# Patient Record
Sex: Female | Born: 1955 | Race: White | Hispanic: No | Marital: Married | Smoking: Former smoker
Health system: Southern US, Community
[De-identification: ages and names within clinical notes are randomized; demographics above are authoritative.]

## PROBLEM LIST (undated history)

## (undated) DIAGNOSIS — S52202A Unspecified fracture of shaft of left ulna, initial encounter for closed fracture: Secondary | ICD-10-CM

## (undated) DIAGNOSIS — S52502A Unspecified fracture of the lower end of left radius, initial encounter for closed fracture: Secondary | ICD-10-CM

## (undated) DIAGNOSIS — M810 Age-related osteoporosis without current pathological fracture: Secondary | ICD-10-CM

## (undated) DIAGNOSIS — G1221 Amyotrophic lateral sclerosis: Secondary | ICD-10-CM

## (undated) DIAGNOSIS — K900 Celiac disease: Secondary | ICD-10-CM

## (undated) HISTORY — PX: FEMUR FRACTURE SURGERY: SHX633

---

## 1992-12-28 HISTORY — PX: ABDOMINAL HYSTERECTOMY: SHX81

## 1998-11-11 ENCOUNTER — Other Ambulatory Visit: Admission: RE | Admit: 1998-11-11 | Discharge: 1998-11-11 | Payer: Self-pay | Admitting: Obstetrics and Gynecology

## 2012-03-29 DIAGNOSIS — K900 Celiac disease: Secondary | ICD-10-CM | POA: Insufficient documentation

## 2013-03-20 DIAGNOSIS — S7221XA Displaced subtrochanteric fracture of right femur, initial encounter for closed fracture: Secondary | ICD-10-CM | POA: Insufficient documentation

## 2013-03-22 DIAGNOSIS — W19XXXA Unspecified fall, initial encounter: Secondary | ICD-10-CM | POA: Insufficient documentation

## 2013-10-16 DIAGNOSIS — R1310 Dysphagia, unspecified: Secondary | ICD-10-CM | POA: Insufficient documentation

## 2013-10-16 DIAGNOSIS — R29898 Other symptoms and signs involving the musculoskeletal system: Secondary | ICD-10-CM | POA: Insufficient documentation

## 2013-10-16 DIAGNOSIS — R471 Dysarthria and anarthria: Secondary | ICD-10-CM | POA: Insufficient documentation

## 2018-01-26 LAB — HM MAMMOGRAPHY

## 2018-02-16 LAB — HM DEXA SCAN

## 2018-02-17 LAB — CBC AND DIFFERENTIAL
HCT: 43 (ref 36–46)
Hemoglobin: 14 (ref 12.0–16.0)
Platelets: 292 (ref 150–399)
WBC: 9.2

## 2018-02-17 LAB — HEPATIC FUNCTION PANEL
ALT: 22 (ref 7–35)
AST: 21 (ref 13–35)
Alkaline Phosphatase: 78 (ref 25–125)
Bilirubin, Total: 0.3

## 2018-02-17 LAB — BASIC METABOLIC PANEL
BUN: 13 (ref 4–21)
Creatinine: 0.9 (ref 0.5–1.1)
Glucose: 85
Potassium: 3.9 (ref 3.4–5.3)
Sodium: 139 (ref 137–147)

## 2018-02-17 LAB — VITAMIN D 25 HYDROXY (VIT D DEFICIENCY, FRACTURES): Vit D, 25-Hydroxy: 29.2

## 2018-02-17 LAB — HM HEPATITIS C SCREENING LAB: HM Hepatitis Screen: NEGATIVE

## 2018-02-17 LAB — LIPID PANEL
LDL Cholesterol: 11
Triglycerides: 56 (ref 40–160)

## 2018-02-23 LAB — HM COLONOSCOPY

## 2018-02-24 LAB — HM PAP SMEAR

## 2019-02-15 ENCOUNTER — Ambulatory Visit
Admission: EM | Admit: 2019-02-15 | Discharge: 2019-02-15 | Disposition: A | Discharge status: Home / Self Care | Payer: Medicare HMO | Attending: Family Medicine | Admitting: Family Medicine

## 2019-02-15 DIAGNOSIS — J069 Acute upper respiratory infection, unspecified: Secondary | ICD-10-CM | POA: Diagnosis not present

## 2019-02-15 DIAGNOSIS — R05 Cough: Secondary | ICD-10-CM

## 2019-02-15 DIAGNOSIS — R059 Cough, unspecified: Secondary | ICD-10-CM

## 2019-02-15 HISTORY — DX: Age-related osteoporosis without current pathological fracture: M81.0

## 2019-02-15 MED ORDER — BENZONATATE 100 MG PO CAPS: ORAL_CAPSULE | ORAL | 0 refills | Status: DC

## 2019-02-15 NOTE — ED Triage Notes (Signed)
Pt c/o cough, nasal/head/chest congestion x1wk

## 2019-02-15 NOTE — ED Provider Notes (Signed)
[facility]   [id number] 02/15/19 Arrival Time: 4825  ASSESSMENT & PLAN:  1. Viral upper respiratory tract infection   2. Cough     Meds ordered this encounter  Medications  . benzonatate (TESSALON) 100 MG capsule    Sig: Take 1 capsule by mouth every 8 (eight) hours for cough.    Dispense:  21 capsule    Refill:  0   Discussed typical duration of symptoms. OTC symptom care as needed. Ensure adequate fluid intake and rest. May f/u with PCP or here as needed.  Reviewed expectations re: course of current medical issues. Questions answered. Outlined signs and symptoms indicating need for more acute intervention. Patient verbalized understanding. After Visit Summary given.   SUBJECTIVE: History from: patient.  Karen Villanueva is a 63 y.o. female who presents with complaint of nasal congestion, post-nasal drainage, and a persistent dry cough; without sore throat. Onset abrupt, over the past week and not worsening. Cough bothering her the most. Mild fatigue. No body aches. SOB: none. Wheezing: none. Fever: no. Overall normal PO intake without n/v. Known sick contacts: no. No specific or significant aggravating or alleviating factors reported. OTC treatment: Robitussin with moderate help.  Social History   Tobacco Use  Smoking Status Never Smoker  Smokeless Tobacco Never Used    ROS: As per HPI.   OBJECTIVE:  Vitals:   02/15/19 1211  BP: (!) 155/97  Pulse: 70  Resp: 18  Temp: 97.9 F (36.6 C)  TempSrc: Oral     General appearance: alert; appears fatigued HEENT: nasal congestion; clear runny nose; throat irritation secondary to post-nasal drainage Neck: supple without LAD CV: RRR Lungs: unlabored respirations, symmetrical air entry without wheezing; cough: mild Abd: soft Ext: no LE edema Skin: warm and dry Psychological: alert and cooperative; normal mood and affect  No Known Allergies  Past Medical History:  Diagnosis Date  . Osteoporosis     No family history on file. Social History   Socioeconomic History  . Marital status: Married    Spouse name: Not on file  . Number of children: Not on file  . Years of education: Not on file  . Highest education level: Not on file  Occupational History  . Not on file  Social Needs  . Financial resource strain: Not on file  . Food insecurity:    Worry: Not on file    Inability: Not on file  . Transportation needs:    Medical: Not on file    Non-medical: Not on file  Tobacco Use  . Smoking status: Never Smoker  . Smokeless tobacco: Never Used  Substance and Sexual Activity  . Alcohol use: Not Currently  . Drug use: Not on file  . Sexual activity: Not on file  Lifestyle  . Physical activity:    Days per week: Not on file    Minutes per session: Not on file  . Stress: Not on file  Relationships  . Social connections:    Talks on phone: Not on file    Gets together: Not on file    Attends religious service: Not on file    Active member of club or organization: Not on file    Attends meetings of clubs or organizations: Not on file    Relationship status: Not on file  . Intimate partner violence:    Fear of current or ex partner: Not on file    Emotionally abused: Not on file    Physically abused: Not on  file    Forced sexual activity: Not on file  Other Topics Concern  . Not on file  Social History Narrative  . Not on file           Vanessa Kick, MD 02/15/19 1302

## 2019-03-15 DIAGNOSIS — H401123 Primary open-angle glaucoma, left eye, severe stage: Secondary | ICD-10-CM | POA: Diagnosis not present

## 2019-03-15 DIAGNOSIS — H401112 Primary open-angle glaucoma, right eye, moderate stage: Secondary | ICD-10-CM | POA: Diagnosis not present

## 2019-03-15 DIAGNOSIS — H2513 Age-related nuclear cataract, bilateral: Secondary | ICD-10-CM | POA: Diagnosis not present

## 2019-03-19 NOTE — Progress Notes (Signed)
Virtual Visit via Telephone Note  I connected with Karen Villanueva on 03/21/19 at 11:15 AM EDT by telephone and verified that I am speaking with the correct person using two identifiers.   I discussed the limitations, risks, security and privacy concerns of performing an evaluation and management service by telephone and the availability of in person appointments. I also discussed with the patient that there may be a patient responsible charge related to this service. The patient expressed understanding and agreed to proceed.   History of Present Illness: Osteoporosis- treated with once weekly Alendronic Acid 70mg  - she reports starting Jan 2019 Glaucoma- followed bu local Optometrist, treated with eye gtt's 2006- ALS- stable, last seen by [facility]ist 2012-"dismissed b/c I my condition is not worsening". She denies using any ambulatory assistance device 2008- Celiac, follows non-wheat diet- denies GI sx's She reports walking daily and going to [facility] twice weekly She estimates to drink >40 oz water/day She denies depression/anxiety She denies insomnia She recently moved from [place], [place] She lives with her husband She denies acute complaints   Observations/Objective: Hoarse voice  Assessment and Plan: Recommend Complete Physical with fasting labs in 3 months Refill on Alendronic Acid 70mg  Remain well hydrated, continue Celiac diet and regular exericse Remain home during COVID-19 Pandemic  Follow Up Instructions:    I discussed the assessment and treatment plan with the patient. The patient was provided an opportunity to ask questions and all were answered. The patient agreed with the plan and demonstrated an understanding of the instructions.   The patient was advised to call back or seek an in-person evaluation if the symptoms worsen or if the condition fails to improve as anticipated.  I provided 30 minutes of non-face-to-face time during this encounter.   Esaw Grandchild, NP   Subjective:    Patient ID: Karen Villanueva, female    DOB: 12-03-1956, 63 y.o.   MRN: [id number]  HPI:  Karen Villanueva is here to establish as a new pt.  She is a pleasant 63 year old female. PMH:  Patient Care Team    Relationship Specialty Notifications Start End  Delta, Berna Spare, NP PCP - General Family Medicine  02/15/19     There are no active problems to display for this patient.    Past Medical History:  Diagnosis Date  . Osteoporosis      No past surgical history on file.   No family history on file.   Social History   Substance and Sexual Activity  Drug Use Not on file     Social History   Substance and Sexual Activity  Alcohol Use Not Currently     Social History   Tobacco Use  Smoking Status Never Smoker  Smokeless Tobacco Never Used     Outpatient Encounter Medications as of 03/21/2019  Medication Sig  . benzonatate (TESSALON) 100 MG capsule Take 1 capsule by mouth every 8 (eight) hours for cough.   No facility-administered encounter medications on file as of 03/21/2019.     Allergies: Patient has no known allergies.  There is no height or weight on file to calculate BMI.  There were no vitals taken for this visit.     Review of Systems     Objective:   Physical Exam        Assessment & Plan:  No diagnosis found.  No problem-specific Assessment & Plan notes found for this encounter.    FOLLOW-UP:  No follow-ups on file.

## 2019-03-21 ENCOUNTER — Other Ambulatory Visit: Payer: Self-pay

## 2019-03-21 ENCOUNTER — Telehealth (INDEPENDENT_AMBULATORY_CARE_PROVIDER_SITE_OTHER): Payer: Medicare HMO | Admitting: Adult Health

## 2019-03-21 DIAGNOSIS — M810 Age-related osteoporosis without current pathological fracture: Secondary | ICD-10-CM

## 2019-03-21 DIAGNOSIS — G1221 Amyotrophic lateral sclerosis: Secondary | ICD-10-CM | POA: Diagnosis not present

## 2019-03-21 MED ORDER — ALENDRONATE SODIUM 70 MG PO TABS: 70.0000 mg | ORAL_TABLET | ORAL | 11 refills | Status: DC

## 2019-03-21 NOTE — Assessment & Plan Note (Signed)
Started Alendronic Acid 70mg  Q weekly Jan 2019- refill sent in today Continue weight bearing exericse

## 2019-03-21 NOTE — Assessment & Plan Note (Signed)
Stable Last contact with [facility] 2012 Ambulates without assistive device

## 2019-05-31 DIAGNOSIS — H5213 Myopia, bilateral: Secondary | ICD-10-CM | POA: Diagnosis not present

## 2019-06-13 ENCOUNTER — Other Ambulatory Visit: Payer: Self-pay

## 2019-06-13 ENCOUNTER — Other Ambulatory Visit: Payer: Medicare HMO

## 2019-06-13 DIAGNOSIS — Z0000 Encounter for general adult medical examination without abnormal findings: Secondary | ICD-10-CM

## 2019-06-13 DIAGNOSIS — E785 Hyperlipidemia, unspecified: Secondary | ICD-10-CM | POA: Diagnosis not present

## 2019-06-14 LAB — COMPREHENSIVE METABOLIC PANEL
ALT: 12 IU/L (ref 0–32)
AST: 17 IU/L (ref 0–40)
Albumin/Globulin Ratio: 1.7 (ref 1.2–2.2)
Albumin: 4.5 g/dL (ref 3.8–4.8)
Alkaline Phosphatase: 70 IU/L (ref 39–117)
BUN/Creatinine Ratio: 14 (ref 12–28)
BUN: 15 mg/dL (ref 8–27)
Bilirubin Total: 0.3 mg/dL (ref 0.0–1.2)
CO2: 24 mmol/L (ref 20–29)
Calcium: 9.6 mg/dL (ref 8.7–10.3)
Chloride: 102 mmol/L (ref 96–106)
Creatinine, Ser: 1.06 mg/dL — ABNORMAL HIGH (ref 0.57–1.00)
GFR calc Af Amer: 65 mL/min/{1.73_m2} (ref >59)
GFR calc non Af Amer: 56 mL/min/{1.73_m2} — ABNORMAL LOW (ref >59)
Globulin, Total: 2.7 g/dL (ref 1.5–4.5)
Glucose: 83 mg/dL (ref 65–99)
Potassium: 4.4 mmol/L (ref 3.5–5.2)
Sodium: 137 mmol/L (ref 134–144)
Total Protein: 7.2 g/dL (ref 6.0–8.5)

## 2019-06-14 LAB — CBC WITH DIFFERENTIAL/PLATELET
Basophils Absolute: 0.1 10*3/uL (ref 0.0–0.2)
Basos: 1 %
EOS (ABSOLUTE): 0.2 10*3/uL (ref 0.0–0.4)
Eos: 2 %
Hematocrit: 41.9 % (ref 34.0–46.6)
Hemoglobin: 14.2 g/dL (ref 11.1–15.9)
Immature Grans (Abs): 0 10*3/uL (ref 0.0–0.1)
Immature Granulocytes: 0 %
Lymphocytes Absolute: 2.6 10*3/uL (ref 0.7–3.1)
Lymphs: 27 %
MCH: 29.3 pg (ref 26.6–33.0)
MCHC: 33.9 g/dL (ref 31.5–35.7)
MCV: 87 fL (ref 79–97)
Monocytes Absolute: 0.7 10*3/uL (ref 0.1–0.9)
Monocytes: 7 %
Neutrophils Absolute: 6 10*3/uL (ref 1.4–7.0)
Neutrophils: 63 %
Platelets: 265 10*3/uL (ref 150–450)
RBC: 4.84 x10E6/uL (ref 3.77–5.28)
RDW: 13.8 % (ref 11.7–15.4)
WBC: 9.6 10*3/uL (ref 3.4–10.8)

## 2019-06-14 LAB — LIPID PANEL
Chol/HDL Ratio: 4.4 ratio (ref 0.0–4.4)
Cholesterol, Total: 202 mg/dL — ABNORMAL HIGH (ref 100–199)
HDL: 46 mg/dL (ref >39)
LDL Calculated: 141 mg/dL — ABNORMAL HIGH (ref 0–99)
Triglycerides: 75 mg/dL (ref 0–149)
VLDL Cholesterol Cal: 15 mg/dL (ref 5–40)

## 2019-06-14 LAB — TSH: TSH: 2.67 u[IU]/mL (ref 0.450–4.500)

## 2019-06-14 LAB — HEMOGLOBIN A1C
Est. average glucose Bld gHb Est-mCnc: 120 mg/dL
Hgb A1c MFr Bld: 5.8 % — ABNORMAL HIGH (ref 4.8–5.6)

## 2019-06-16 ENCOUNTER — Other Ambulatory Visit: Payer: Medicare HMO

## 2019-06-21 ENCOUNTER — Other Ambulatory Visit: Payer: Self-pay

## 2019-06-21 ENCOUNTER — Encounter: Payer: Self-pay | Admitting: Adult Health

## 2019-06-21 ENCOUNTER — Ambulatory Visit (INDEPENDENT_AMBULATORY_CARE_PROVIDER_SITE_OTHER): Payer: Medicare HMO | Admitting: Adult Health

## 2019-06-21 VITALS — Temp 97.7°F | Wt 161.0 lb

## 2019-06-21 DIAGNOSIS — Z0000 Encounter for general adult medical examination without abnormal findings: Secondary | ICD-10-CM | POA: Diagnosis not present

## 2019-06-21 NOTE — Progress Notes (Signed)
Virtual Visit via Telephone Note  I connected with Karen Villanueva on 06/21/19 at 11:15 AM EDT by telephone and verified that I am speaking with the correct person using two identifiers.  Location: Patient: Home Provider: In Clinic   I discussed the limitations, risks, security and privacy concerns of performing an evaluation and management service by telephone and the availability of in person appointments. I also discussed with the patient that there may be a patient responsible charge related to this service. The patient expressed understanding and agreed to proceed.     Subjective:   Karen Villanueva is a 63 y.o. female who presents for Medicare Annual (Subsequent) preventive examination.  Review of Systems: General:   Denies fever, chills, unexplained weight loss.  Optho/Auditory:   Denies visual changes, blurred vision/LOV Respiratory:   Denies SOB, DOE more than baseline levels.  Cardiovascular:   Denies chest pain, palpitations, new onset peripheral edema  Gastrointestinal:   Denies nausea, vomiting, diarrhea.  Genitourinary: Denies dysuria, freq/ urgency, flank pain or discharge from genitals.  Endocrine:     Denies hot or cold intolerance, polyuria, polydipsia. Musculoskeletal:   Denies unexplained myalgias, joint swelling, unexplained arthralgias, gait problems.  Skin:  Denies rash, suspicious lesions Neurological:     Denies dizziness, unexplained weakness, numbness  Psychiatric/Behavioral:   Denies mood changes, suicidal or homicidal ideations, hallucinations This patient does not have sx concerning for COVID-19 Infection (ie; fever, chills, cough, new or worsening shortness of breath).      Objective:     Vitals: Temp 97.7 F (36.5 C) (Oral)   Wt 161 lb (73 kg)   There is no height or weight on file to calculate BMI.  No flowsheet data found.  Tobacco Social History   Tobacco Use  Smoking Status Never Smoker  Smokeless Tobacco Never Used     Counseling given:  Not Answered   Clinical Intake: 06/13/2019 Labs- TSH-WNL, 2.670  The 10-year ASCVD risk score Mikey Bussing DC Jr., et al., 2013) is: 7.2%  Values used to calculate the score:   Age: 60 years   Sex: Female   Is Non-Hispanic African American: No   Diabetic: No   Tobacco smoker: No   Systolic Blood Pressure: 811 mmHg   Is BP treated: No   HDL Cholesterol: 46 mg/dL   Total Cholesterol: 202 mg/dL  LDL-141  A1c-5.8- pre-diabetic  CMP- Stable  GFR 56  CBC- stable   Recommend reducing CHO/sugar/saturated fat- check annually   Past Medical History:  Diagnosis Date  . Osteoporosis    History reviewed. No pertinent surgical history. History reviewed. No pertinent family history. Social History   Socioeconomic History  . Marital status: Married    Spouse name: Not on file  . Number of children: Not on file  . Years of education: Not on file  . Highest education level: Not on file  Occupational History  . Not on file  Social Needs  . Financial resource strain: Not on file  . Food insecurity    Worry: Not on file    Inability: Not on file  . Transportation needs    Medical: Not on file    Non-medical: Not on file  Tobacco Use  . Smoking status: Never Smoker  . Smokeless tobacco: Never Used  Substance and Sexual Activity  . Alcohol use: Not Currently  . Drug use: Not on file  . Sexual activity: Not on file  Lifestyle  . Physical activity    Days per week:  Not on file    Minutes per session: Not on file  . Stress: Not on file  Relationships  . Social Herbalist on phone: Not on file    Gets together: Not on file    Attends religious service: Not on file    Active member of club or organization: Not on file    Attends meetings of clubs or organizations: Not on file    Relationship status: Not on file  Other Topics Concern  . Not on file  Social History Narrative  . Not on file    Outpatient Encounter Medications as of 06/21/2019   Medication Sig  . alendronate (FOSAMAX) 70 MG tablet Take 1 tablet (70 mg total) by mouth every 7 (seven) days. Take with a full glass of water on an empty stomach.  . [DISCONTINUED] benzonatate (TESSALON) 100 MG capsule Take 1 capsule by mouth every 8 (eight) hours for cough.   No facility-administered encounter medications on file as of 06/21/2019.     Activities of Daily Living In your present state of health, do you have any difficulty performing the following activities: 06/21/2019  Hearing? N  Vision? N  Difficulty concentrating or making decisions? N  Walking or climbing stairs? N  Dressing or bathing? N  Doing errands, shopping? N  Some recent data might be hidden    Patient Care Team: Esaw Grandchild, NP as PCP - General (Family Medicine)    Assessment:   This is a routine wellness examination for [name].  Exercise Activities and Dietary recommendations    Goals   None     Fall Risk Fall Risk  06/21/2019  Falls in the past year? 0  Follow up Falls evaluation completed   Is the patient's home free of loose throw rugs in walkways, pet beds, electrical cords, etc?   yes      Grab bars in the bathroom? yes      Handrails on the stairs?   no      Adequate lighting?   yes  Timed Get Up and Go performed: N/A, encounter not performed in clinic  Depression Screen PHQ 2/9 Scores 06/21/2019  PHQ - 2 Score 0  PHQ- 9 Score 0     Cognitive Function-WNL     6CIT Screen 06/21/2019  What Year? 0 points  What month? 0 points  What time? 0 points  Count back from 20 0 points  Months in reverse 0 points  Repeat phrase 0 points  Total Score 0    Immunization History  Administered Date(s) Administered  . Zoster 07/07/2016    Qualifies for Shingles Vaccine?Yes  Screening Tests Health Maintenance  Topic Date Due  . Hepatitis C Screening  09-May-1956  . HIV Screening  11/11/1970  . TETANUS/TDAP  11/11/1974  . PAP SMEAR-Modifier  11/11/1976  . MAMMOGRAM   11/11/2005  . COLONOSCOPY  11/11/2005  . INFLUENZA VACCINE  07/29/2019    Cancer Screenings: Lung: Low Dose CT Chest recommended if Age 72-80 years, 30 pack-year currently smoking OR have quit w/in 15years. Patient does not qualify. Breast:  Up to date on Mammogram? No   Up to date of Bone Density/Dexa? Yes Colorectal: Tracking down report  Additional Screenings: Hepatitis C Screening: Past Due     Plan:  Continue Alendronate (Fosamax) 42m once weekly- started therapy Jan 2019 Stable ALS, [facility] Remain well hydrated and reduce saturated fat/sugar/CHO to help reduce A1c and LDL cholesterol- will recheck  next year Recommend f/u 12 months- CPE I have personally reviewed and noted the following in the patient's chart:   . Medical and social history . Use of alcohol, tobacco or illicit drugs  . Current medications and supplements . Functional ability and status . Nutritional status . Physical activity . Advanced directives . List of other physicians . Hospitalizations, surgeries, and ER visits in previous 12 months . Vitals . Screenings to include cognitive, depression, and falls . Referrals and appointments  In addition, I have reviewed and discussed with patient certain preventive protocols, quality metrics, and best practice recommendations. A written personalized care plan for preventive services as well as general preventive health recommendations were provided to patient.     Esaw Grandchild, NP  06/21/2019

## 2019-06-21 NOTE — Assessment & Plan Note (Signed)
Continue Alendronate (Fosamax) 70mg  once weekly- started therapy Jan 2019 Stable ALS, last [facility] contact 2012 Remain well hydrated and reduce saturated fat/sugar/CHO to help reduce A1c and LDL cholesterol- will recheck next year Recommend f/u 12 months- CPE I have personally reviewed and noted the following in the patient's chart:   . Medical and social history . Use of alcohol, tobacco or illicit drugs  . Current medications and supplements . Functional ability and status . Nutritional status . Physical activity . Advanced directives . List of other physicians . Hospitalizations, surgeries, and ER visits in previous 12 months . Vitals . Screenings to include cognitive, depression, and falls . Referrals and appointments  In addition, I have reviewed and discussed with patient certain preventive protocols, quality metrics, and best practice recommendations. A written personalized care plan for preventive services as well as general preventive health recommendations were provided to patient.

## 2019-06-22 ENCOUNTER — Encounter: Payer: Self-pay | Admitting: Adult Health

## 2019-07-15 DIAGNOSIS — H524 Presbyopia: Secondary | ICD-10-CM | POA: Diagnosis not present

## 2019-07-15 DIAGNOSIS — H52209 Unspecified astigmatism, unspecified eye: Secondary | ICD-10-CM | POA: Diagnosis not present

## 2019-07-15 DIAGNOSIS — H5213 Myopia, bilateral: Secondary | ICD-10-CM | POA: Diagnosis not present

## 2019-09-08 ENCOUNTER — Encounter (HOSPITAL_COMMUNITY): Payer: Self-pay

## 2019-09-08 ENCOUNTER — Other Ambulatory Visit: Payer: Self-pay

## 2019-09-08 ENCOUNTER — Emergency Department (HOSPITAL_COMMUNITY)
Admission: EM | Admit: 2019-09-08 | Discharge: 2019-09-08 | Disposition: A | Discharge status: Home / Self Care | Payer: Medicare HMO | Attending: Emergency Medicine | Admitting: Emergency Medicine

## 2019-09-08 ENCOUNTER — Emergency Department (HOSPITAL_COMMUNITY): Payer: Medicare HMO

## 2019-09-08 DIAGNOSIS — W010XXA Fall on same level from slipping, tripping and stumbling without subsequent striking against object, initial encounter: Secondary | ICD-10-CM | POA: Insufficient documentation

## 2019-09-08 DIAGNOSIS — Y92480 Sidewalk as the place of occurrence of the external cause: Secondary | ICD-10-CM | POA: Diagnosis not present

## 2019-09-08 DIAGNOSIS — S6992XA Unspecified injury of left wrist, hand and finger(s), initial encounter: Secondary | ICD-10-CM | POA: Diagnosis not present

## 2019-09-08 DIAGNOSIS — S01511A Laceration without foreign body of lip, initial encounter: Secondary | ICD-10-CM

## 2019-09-08 DIAGNOSIS — W19XXXA Unspecified fall, initial encounter: Secondary | ICD-10-CM

## 2019-09-08 DIAGNOSIS — S0181XA Laceration without foreign body of other part of head, initial encounter: Secondary | ICD-10-CM | POA: Diagnosis not present

## 2019-09-08 DIAGNOSIS — M79642 Pain in left hand: Secondary | ICD-10-CM | POA: Diagnosis not present

## 2019-09-08 DIAGNOSIS — Y999 Unspecified external cause status: Secondary | ICD-10-CM | POA: Insufficient documentation

## 2019-09-08 DIAGNOSIS — S6991XA Unspecified injury of right wrist, hand and finger(s), initial encounter: Secondary | ICD-10-CM | POA: Diagnosis not present

## 2019-09-08 DIAGNOSIS — M25531 Pain in right wrist: Secondary | ICD-10-CM | POA: Diagnosis not present

## 2019-09-08 DIAGNOSIS — S6392XA Sprain of unspecified part of left wrist and hand, initial encounter: Secondary | ICD-10-CM

## 2019-09-08 DIAGNOSIS — Z23 Encounter for immunization: Secondary | ICD-10-CM | POA: Insufficient documentation

## 2019-09-08 DIAGNOSIS — Y9301 Activity, walking, marching and hiking: Secondary | ICD-10-CM | POA: Diagnosis not present

## 2019-09-08 HISTORY — DX: Amyotrophic lateral sclerosis: G12.21

## 2019-09-08 HISTORY — DX: Celiac disease: K90.0

## 2019-09-08 MED ORDER — CEPHALEXIN 500 MG PO CAPS: 500.0000 mg | ORAL_CAPSULE | Freq: Four times a day (QID) | ORAL | 0 refills | Status: DC

## 2019-09-08 MED ORDER — TETANUS-DIPHTH-ACELL PERTUSSIS 5-2.5-18.5 LF-MCG/0.5 IM SUSP
0.5000 mL | Freq: Once | INTRAMUSCULAR | Status: AC
  Administered 2019-09-08: 13:00:00 0.5 mL via INTRAMUSCULAR
  Filled 2019-09-08: qty 0.5

## 2019-09-08 MED ORDER — MORPHINE SULFATE (PF) 4 MG/ML IV SOLN
4.0000 mg | INTRAVENOUS | Status: DC | PRN
  Administered 2019-09-08: 4 mg via INTRAVENOUS
  Filled 2019-09-08: qty 1

## 2019-09-08 MED ORDER — CEPHALEXIN 500 MG PO CAPS
500.0000 mg | ORAL_CAPSULE | Freq: Once | ORAL | Status: AC
  Administered 2019-09-08: 19:00:00 500 mg via ORAL
  Filled 2019-09-08: qty 1

## 2019-09-08 MED ORDER — MORPHINE SULFATE (PF) 4 MG/ML IV SOLN
4.0000 mg | Freq: Once | INTRAVENOUS | Status: AC
  Administered 2019-09-08: 4 mg via INTRAVENOUS
  Filled 2019-09-08: qty 1

## 2019-09-08 MED ORDER — LIDOCAINE-EPINEPHRINE-TETRACAINE (LET) SOLUTION
3.0000 mL | Freq: Once | NASAL | Status: AC
  Administered 2019-09-08: 3 mL via TOPICAL
  Filled 2019-09-08: qty 3

## 2019-09-08 MED ORDER — TRAMADOL HCL 50 MG PO TABS: 50.0000 mg | ORAL_TABLET | Freq: Four times a day (QID) | ORAL | 0 refills | Status: DC | PRN

## 2019-09-08 NOTE — Consult Note (Signed)
Reason for Consult:lip laceration Referring Physician: er  Karen Villanueva is an 63 y.o. female.  HPI: hx of fall and hit face and sustained a lip laceration. She has no malocclusion. No nasal obstruction. No dental injury.   Past Medical History:  Diagnosis Date  . ALS (amyotrophic lateral sclerosis) ([place])   . Celiac disease   . Osteoporosis     History reviewed. No pertinent surgical history.  Family History  Problem Relation Age of Onset  . Heart failure Mother   . Diabetes Father     Social History:  reports that she has never smoked. She has never used smokeless tobacco. She reports previous alcohol use. She reports that she does not use drugs.  Allergies:  Allergies  Allergen Reactions  . Wheat Bran Diarrhea    Other reaction(s): Other (See Comments) Celiac Disease, NO GLUTEN     Medications: I have reviewed the patient's current medications.  No results found for this or any previous visit (from the past 48 hour(s)).  Dg Hand Complete Left  Result Date: 09/08/2019 CLINICAL DATA:  Hand pain after fall. EXAM: LEFT HAND - COMPLETE 3+ VIEW COMPARISON:  None. FINDINGS: There is no evidence of fracture or dislocation. There is no evidence of arthropathy or other focal bone abnormality. Osteopenia. Soft tissues are unremarkable. IMPRESSION: No acute osseous abnormality. Electronically Signed   By: Titus Dubin M.D.   On: 09/08/2019 12:51    ROS Blood pressure (!) 144/61, pulse 63, temperature (!) 97.5 F (36.4 C), temperature source Oral, resp. rate 18, height 5\' 8"  (1.727 m), weight 73.5 kg, SpO2 96 %. Physical Exam  Constitutional: She appears well-developed and well-nourished.  HENT:  Head: Normocephalic and atraumatic.  Complex lip lkaceration through the vermillion border of the midline upper lip. It is through the edge and muscle exposed. dention looks good. No malocclusion  Eyes: Pupils are equal, round, and reactive to light. Conjunctivae are normal.  Neck:  Normal range of motion. Neck supple.    Assessment/Plan: Complex lip laceration 2.5 cm- discussed with her risks, benefits and options. All questions answered and consent obtained. The area was preped and draped and injected with lidocaine 1% with epi. Irrigated. The vermillion border approximated with 4-0 nylon. The deep closed with 4-0 chromic and muscle back together. The mucosal side closed with 4-0 and skin with 4-0 nylon. She tolerated well. Follow up in 1 week. Wound care given. Keflex 500mg  TID for 5 days.   Melissa Montane 09/08/2019, 6:17 PM

## 2019-09-08 NOTE — ED Notes (Signed)
Pt in with Dr. Etheleen Sia.

## 2019-09-08 NOTE — ED Notes (Signed)
Patient transported to X-ray 

## 2019-09-08 NOTE — ED Notes (Signed)
Patient ambulated to bathroom.

## 2019-09-08 NOTE — ED Notes (Signed)
Pt states she changed her mind and would like pain medication prior to the procedure.

## 2019-09-08 NOTE — ED Notes (Signed)
Reassessed PT's pain. She states most of her current pain is in her right wrist. Pt rates the pain as a 7/10, she denies any radiation of pain. Pt states her lip still feels numb and rates the pain 2/10. She denies pain in her left wrist which is currently ace wrapped. She states that "top of her hand just feels sensitive". Pt offered pain medication but she refused. She states she wants to wait for the consulting emergency plastic surgeon to get here.

## 2019-09-08 NOTE — ED Notes (Signed)
Husband Shanon Brow [phone]

## 2019-09-08 NOTE — ED Triage Notes (Signed)
Patient states she stepped on an uneven sidewalk, tripped and fell. Patient c/o lip injury and left hand injury. Patient has extensive damage t her upper lip.

## 2019-09-08 NOTE — ED Provider Notes (Signed)
[facility] DEPT Provider Note   CSN: [id number] Arrival date & time: 09/08/19  1119     History   Chief Complaint Chief Complaint  Patient presents with  . Fall  . lip injury  . Hand Injury    HPI JUNICE NEALLY is a 63 y.o. female.     The history is provided by the patient. No language interpreter was used.  Fall  Hand Injury    63 year old female with history of ALS, osteoporosis, presenting for evaluation of a fall.  Patient report approximately 2 hours ago she was walking and stepped on uneven ground likely a curb, lost balance, fell forward and struck her face against hard ground.  She did reach out to break the fall with both of her hands and did report mild discomfort to the left hand.  Pain is sharp, throbbing, nonradiating, 3 out of 10.  She also complaining of laceration to her lip.  Pain in the lip is throbbing, 5 out of 10, nonradiating.  No headache, no loss of consciousness, no neck pain, no chest pain or shortness of breath no abdominal pain no back pain no precipitating symptoms prior to the fall.  Denies any significant dental pain.  She is not on any blood thinner medication.  Past Medical History:  Diagnosis Date  . ALS (amyotrophic lateral sclerosis) ([place])   . Celiac disease   . Osteoporosis     Patient Active Problem List   Diagnosis Date Noted  . Medicare annual wellness visit, subsequent 06/21/2019  . ALS (amyotrophic lateral sclerosis) ([place]) 03/21/2019  . Osteoporosis 03/21/2019    History reviewed. No pertinent surgical history.   OB History   No obstetric history on file.      Home Medications    Prior to Admission medications   Medication Sig Start Date End Date Taking? Authorizing Provider  alendronate (FOSAMAX) 70 MG tablet Take 1 tablet (70 mg total) by mouth every 7 (seven) days. Take with a full glass of water on an empty stomach. 03/21/19   Esaw Grandchild, NP    Family History Family History   Problem Relation Age of Onset  . Heart failure Mother   . Diabetes Father     Social History Social History   Tobacco Use  . Smoking status: Never Smoker  . Smokeless tobacco: Never Used  Substance Use Topics  . Alcohol use: Not Currently  . Drug use: Never     Allergies   Wheat bran   Review of Systems Review of Systems  All other systems reviewed and are negative.    Physical Exam Updated Vital Signs BP (!) 175/103 (BP Location: Left Arm)   Pulse 66   Temp (!) 97.5 F (36.4 C) (Oral)   Resp 18   Ht 5\' 8"  (1.727 m)   Wt 73.5 kg   SpO2 98%   BMI 24.63 kg/m   Physical Exam Vitals signs and nursing note reviewed.  Constitutional:      General: She is not in acute distress.    Appearance: She is well-developed.  HENT:     Head: Normocephalic.     Comments: 3 cm through and through laceration at the cleft  extending towards the frenulum of the upper lip without any foreign body noted.  No significant dental pain or dental extrusion or intrusion.  No trismus.  No hemotympanum, no septal hematoma, no malocclusion, no midface tenderness. Eyes:     Conjunctiva/sclera: Conjunctivae normal.  Neck:  Musculoskeletal: Neck supple. No neck rigidity or muscular tenderness.     Comments: No significant midline spine tenderness Cardiovascular:     Rate and Rhythm: Normal rate and regular rhythm.     Pulses: Normal pulses.     Heart sounds: Normal heart sounds.  Pulmonary:     Effort: Pulmonary effort is normal.     Breath sounds: Normal breath sounds.  Abdominal:     Palpations: Abdomen is soft.     Tenderness: There is no abdominal tenderness.  Musculoskeletal:        General: Tenderness (Left hand: Mild tenderness to second metacarpal region without any deformity or bruising.  Normal grip strength.  No pain at anatomical snuffbox.) present.     Comments: Right hand nontender, bilateral wrist nontender with full range of motion, bilateral elbow nontender with  full range of motion.  Skin:    Findings: No rash.  Neurological:     Mental Status: She is alert and oriented to person, place, and time.  Psychiatric:        Mood and Affect: Mood normal.            ED Treatments / Results  Labs (all labs ordered are listed, but only abnormal results are displayed) Labs Reviewed - No data to display  EKG None  Radiology Dg Hand Complete Left  Result Date: 09/08/2019 CLINICAL DATA:  Hand pain after fall. EXAM: LEFT HAND - COMPLETE 3+ VIEW COMPARISON:  None. FINDINGS: There is no evidence of fracture or dislocation. There is no evidence of arthropathy or other focal bone abnormality. Osteopenia. Soft tissues are unremarkable. IMPRESSION: No acute osseous abnormality. Electronically Signed   By: Titus Dubin M.D.   On: 09/08/2019 12:51    Procedures Procedures (including critical care time)  Medications Ordered in ED Medications  morphine 4 MG/ML injection 4 mg (has no administration in time range)  morphine 4 MG/ML injection 4 mg (4 mg Intravenous Given 09/08/19 1319)  lidocaine-EPINEPHrine-tetracaine (LET) solution (3 mLs Topical Given 09/08/19 1313)  Tdap (BOOSTRIX) injection 0.5 mL (0.5 mLs Intramuscular Given 09/08/19 1322)     Initial Impression / Assessment and Plan / ED Course  I have reviewed the triage vital signs and the nursing notes.  Pertinent labs & imaging results that were available during my care of the patient were reviewed by me and considered in my medical decision making (see chart for details).        BP (!) 175/103 (BP Location: Left Arm)   Pulse 66   Temp (!) 97.5 F (36.4 C) (Oral)   Resp 18   Ht 5\' 8"  (1.727 m)   Wt 73.5 kg   SpO2 98%   BMI 24.63 kg/m    Final Clinical Impressions(s) / ED Diagnoses   Final diagnoses:  Fall, initial encounter  Laceration of lip, complicated, initial encounter  Hand sprain, left, initial encounter    ED Discharge Orders    None     1:20 PM Patient  here with a mechanical fall injuring her face.  She suffered a 3 cm through and through laceration to her mid upper lip at the cleft which would likely benefit from plastic surgery flap repair.  Does complain of left hand pain from the fall however x-ray is negative for fracture or dislocation.  No other signs of injury noted on exam.  No precipitating symptoms prior to the fall.  Will update tetanus status, apply LET on the wound and consult plastic surgeon.  Care discussed with Dr. Rex Kras.   2:37 PM ENT specialist Dr. Janace Hoard were consulted for laceration repair.  He is currently performing surgery and would be available in approximately 4 hrs.  He recommend for [place] to close the wound but if pt prefers to wait he will be able to perform lac repair once he is done with his surgery.  I will discuss this with pt.   2:53 PM Pt agrees to wait for ENT specialist for lac repair.  Dr. Rex Kras also have seen pt and agrees.  Pt sign out to oncoming provider who will call ENT around 7p to ensure he will come for suture repair.     Domenic Moras, PA-C 09/08/19 1512    Fredia Sorrow, MD 09/08/19 1930

## 2019-09-08 NOTE — Discharge Instructions (Addendum)
Take the antibiotic as directed.  Take the tramadol as needed for pain.  Follow-up for wound care and suture removal with Dr. Janace Hoard from ear nose and throat.  Follow-up with your primary care doctor for the bilateral wrist sprain as needed.

## 2019-09-08 NOTE — ED Notes (Signed)
Pt reports tripping on a curb while walking and fell-landing on her face.  Large upper lip lac noted.  She denies LOC.  Denies feeling dizzy prior to the fall.  She also endorses L hand pain-no obvious deformity noted.  CMS intact.

## 2019-09-25 ENCOUNTER — Ambulatory Visit: Payer: Medicare HMO | Admitting: Adult Health

## 2019-09-27 DIAGNOSIS — H401123 Primary open-angle glaucoma, left eye, severe stage: Secondary | ICD-10-CM | POA: Diagnosis not present

## 2019-09-28 DIAGNOSIS — M8440XA Pathological fracture, unspecified site, initial encounter for fracture: Secondary | ICD-10-CM | POA: Insufficient documentation

## 2019-09-28 DIAGNOSIS — D1720 Benign lipomatous neoplasm of skin and subcutaneous tissue of unspecified limb: Secondary | ICD-10-CM | POA: Insufficient documentation

## 2019-09-28 DIAGNOSIS — E785 Hyperlipidemia, unspecified: Secondary | ICD-10-CM | POA: Insufficient documentation

## 2019-09-28 DIAGNOSIS — M25559 Pain in unspecified hip: Secondary | ICD-10-CM | POA: Insufficient documentation

## 2019-09-28 DIAGNOSIS — I878 Other specified disorders of veins: Secondary | ICD-10-CM | POA: Insufficient documentation

## 2019-10-19 ENCOUNTER — Other Ambulatory Visit: Payer: Self-pay

## 2019-10-19 ENCOUNTER — Emergency Department (HOSPITAL_COMMUNITY)
Admission: EM | Admit: 2019-10-19 | Discharge: 2019-10-19 | Disposition: A | Discharge status: Home / Self Care | Payer: Medicare HMO | Attending: Emergency Medicine | Admitting: Emergency Medicine

## 2019-10-19 ENCOUNTER — Ambulatory Visit (INDEPENDENT_AMBULATORY_CARE_PROVIDER_SITE_OTHER): Payer: Medicare HMO

## 2019-10-19 ENCOUNTER — Encounter: Payer: Self-pay | Admitting: Emergency Medicine

## 2019-10-19 ENCOUNTER — Ambulatory Visit
Admission: EM | Admit: 2019-10-19 | Discharge: 2019-10-19 | Disposition: A | Discharge status: Home / Self Care | Payer: Medicare HMO | Source: Home / Self Care

## 2019-10-19 ENCOUNTER — Encounter (HOSPITAL_COMMUNITY): Payer: Self-pay | Admitting: Emergency Medicine

## 2019-10-19 DIAGNOSIS — S52122A Displaced fracture of head of left radius, initial encounter for closed fracture: Secondary | ICD-10-CM | POA: Diagnosis not present

## 2019-10-19 DIAGNOSIS — S52692A Other fracture of lower end of left ulna, initial encounter for closed fracture: Secondary | ICD-10-CM

## 2019-10-19 DIAGNOSIS — Z20828 Contact with and (suspected) exposure to other viral communicable diseases: Secondary | ICD-10-CM | POA: Insufficient documentation

## 2019-10-19 DIAGNOSIS — S6992XA Unspecified injury of left wrist, hand and finger(s), initial encounter: Secondary | ICD-10-CM | POA: Diagnosis present

## 2019-10-19 DIAGNOSIS — Y999 Unspecified external cause status: Secondary | ICD-10-CM | POA: Diagnosis not present

## 2019-10-19 DIAGNOSIS — W548XXA Other contact with dog, initial encounter: Secondary | ICD-10-CM | POA: Diagnosis not present

## 2019-10-19 DIAGNOSIS — W19XXXA Unspecified fall, initial encounter: Secondary | ICD-10-CM

## 2019-10-19 DIAGNOSIS — W010XXA Fall on same level from slipping, tripping and stumbling without subsequent striking against object, initial encounter: Secondary | ICD-10-CM | POA: Insufficient documentation

## 2019-10-19 DIAGNOSIS — Z03818 Encounter for observation for suspected exposure to other biological agents ruled out: Secondary | ICD-10-CM | POA: Diagnosis not present

## 2019-10-19 DIAGNOSIS — S62102A Fracture of unspecified carpal bone, left wrist, initial encounter for closed fracture: Secondary | ICD-10-CM | POA: Diagnosis not present

## 2019-10-19 DIAGNOSIS — G1221 Amyotrophic lateral sclerosis: Secondary | ICD-10-CM | POA: Diagnosis not present

## 2019-10-19 DIAGNOSIS — Y9301 Activity, walking, marching and hiking: Secondary | ICD-10-CM | POA: Insufficient documentation

## 2019-10-19 DIAGNOSIS — Z87891 Personal history of nicotine dependence: Secondary | ICD-10-CM | POA: Insufficient documentation

## 2019-10-19 DIAGNOSIS — Y929 Unspecified place or not applicable: Secondary | ICD-10-CM | POA: Diagnosis not present

## 2019-10-19 DIAGNOSIS — S52592A Other fractures of lower end of left radius, initial encounter for closed fracture: Secondary | ICD-10-CM | POA: Diagnosis not present

## 2019-10-19 DIAGNOSIS — S6292XA Unspecified fracture of left wrist and hand, initial encounter for closed fracture: Secondary | ICD-10-CM | POA: Diagnosis not present

## 2019-10-19 LAB — SARS CORONAVIRUS 2 BY RT PCR (HOSPITAL ORDER, PERFORMED IN ~~LOC~~ HOSPITAL LAB): SARS Coronavirus 2: NEGATIVE

## 2019-10-19 MED ORDER — HYDROCODONE-ACETAMINOPHEN 5-325 MG PO TABS
1.0000 | ORAL_TABLET | Freq: Once | ORAL | Status: AC
  Administered 2019-10-19: 16:00:00 1 via ORAL
  Filled 2019-10-19: qty 1

## 2019-10-19 MED ORDER — MORPHINE SULFATE (PF) 4 MG/ML IV SOLN
4.0000 mg | Freq: Once | INTRAVENOUS | Status: AC
  Administered 2019-10-19: 12:00:00 4 mg via INTRAVENOUS
  Filled 2019-10-19: qty 1

## 2019-10-19 MED ORDER — HYDROCODONE-ACETAMINOPHEN 5-325 MG PO TABS: 1.0000 | ORAL_TABLET | Freq: Four times a day (QID) | ORAL | 0 refills | Status: DC | PRN

## 2019-10-19 MED ORDER — HYDROCODONE-ACETAMINOPHEN 5-325 MG PO TABS: 2.0000 | ORAL_TABLET | ORAL | 0 refills | Status: DC | PRN

## 2019-10-19 MED ORDER — DOXYCYCLINE HYCLATE 100 MG PO CAPS: 100.0000 mg | ORAL_CAPSULE | Freq: Two times a day (BID) | ORAL | 0 refills | Status: DC

## 2019-10-19 MED ORDER — CEFAZOLIN SODIUM-DEXTROSE 2-4 GM/100ML-% IV SOLN
2.0000 g | Freq: Once | INTRAVENOUS | Status: AC
  Administered 2019-10-19: 12:00:00 2 g via INTRAVENOUS
  Filled 2019-10-19: qty 100

## 2019-10-19 NOTE — ED Provider Notes (Signed)
[facility]    CSN: [id number] Arrival date & time: 10/19/19  [id number]      History   Chief Complaint Chief Complaint  Patient presents with  . Wrist Pain    HPI Karen Villanueva is a 63 y.o. female.   63 year old female with history of ALS, osteoporosis, celiac disease comes in for left wrist injury after fall last night.  States she was pulled down by her dog, catching herself with her hands.  She is unsure how she landed, but denies any head injury or loss of consciousness.  She states only pain she feels is to the left wrist.  Had increased swelling last night that has slightly improved today.  Has abrasions to the wrist.  Denies numbness, tingling.  Due to ALS, she has decreased range of motion and strength to the left hand/fingers at baseline, denies any changes.  She has full range of motion of wrist at baseline, for which has decreased since injury.  Took tramadol last night with relief.     Past Medical History:  Diagnosis Date  . ALS (amyotrophic lateral sclerosis) ([place])   . Celiac disease   . Osteoporosis     Patient Active Problem List   Diagnosis Date Noted  . Hip pain 09/28/2019  . Hyperlipidemia 09/28/2019  . 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 Pathological fracture 09/28/2019  . Venous stasis 09/28/2019  . Medicare annual wellness visit, subsequent 06/21/2019  . ALS (amyotrophic lateral sclerosis) ([place]) 03/21/2019  . Osteoporosis 03/21/2019  . Bilateral arm weakness 10/16/2013  . Bilateral leg weakness 10/16/2013  . Dysarthria 10/16/2013  . Dysphagia 10/16/2013  . Fall from standing 03/22/2013  . Subtrochanteric fracture of right femur ([place]) 03/20/2013  . Celiac disease 03/29/2012    Past Surgical History:  Procedure Laterality Date  . ABDOMINAL HYSTERECTOMY  1994    OB History   No obstetric history on file.      Home Medications    Prior to Admission medications   Medication Sig Start Date End Date Taking? Authorizing Provider   alendronate (FOSAMAX) 70 MG tablet Take 1 tablet (70 mg total) by mouth every 7 (seven) days. Take with a full glass of water on an empty stomach. 03/21/19   Danford, Valetta Fuller D, NP  Melatonin 5 MG TABS Take 5 mg by mouth at bedtime.    [provider]  traMADol (ULTRAM) 50 MG tablet Take 1 tablet (50 mg total) by mouth every 6 (six) hours as needed. 09/08/19   Fredia Sorrow, MD    Family History Family History  Problem Relation Age of Onset  . Heart failure Mother   . Diabetes Father     Social History Social History   Tobacco Use  . Smoking status: Former Research scientist (life sciences)  . Smokeless tobacco: Never Used  Substance Use Topics  . Alcohol use: Not Currently  . Drug use: Never     Allergies   Wheat bran   Review of Systems Review of Systems  Reason unable to perform ROS: See HPI as above.     Physical Exam Triage Vital Signs ED Triage Vitals  Enc Vitals Group     BP 10/19/19 0838 101/69     Pulse Rate 10/19/19 0838 69     Resp 10/19/19 0838 18     Temp 10/19/19 0838 98.6 F (37 C)     Temp Source 10/19/19 0838 Oral     SpO2 10/19/19 0838 95 %     Weight --  Height --      Head Circumference --      Peak Flow --      Pain Score 10/19/19 0837 8     Pain Loc --      Pain Edu? --      Excl. in [place]? --    No data found.  Updated Vital Signs BP 101/69 (BP Location: Right Arm)   Pulse 69   Temp 98.6 F (37 C) (Oral)   Resp 18   SpO2 95%   Physical Exam Constitutional:      General: She is not in acute distress.    Appearance: She is well-developed. She is not diaphoretic.  HENT:     Head: Normocephalic and atraumatic.  Eyes:     Conjunctiva/sclera: Conjunctivae normal.     Pupils: Pupils are equal, round, and reactive to light.  Pulmonary:     Effort: Pulmonary effort is normal. No respiratory distress.  Musculoskeletal:     Comments: Exam limited due to swelling and deformity. Swelling to the left proximal hand, wrist, extending to mid forearm.  Abrasions to the left dorsal wrist, bleeding controlled. Unable to confidently feel for landmarks due to swelling. Diffuse tenderness to palpation. ROM and strength deferred for the wrist. ROM of fingers at baseline per patient. Sensation intact. Radial pulse unable to be felt, ?due to swelling. Fingers pink and warm with cap refill <2s.   Skin:    General: Skin is warm and dry.  Neurological:     Mental Status: She is alert and oriented to person, place, and time.      UC Treatments / Results  Labs (all labs ordered are listed, but only abnormal results are displayed) Labs Reviewed - No data to display  EKG   Radiology Dg Wrist Complete Left  Result Date: 10/19/2019 CLINICAL DATA:  Fall, left wrist pain EXAM: LEFT WRIST - COMPLETE 3+ VIEW COMPARISON:  09/08/2019 FINDINGS: There is a comminuted, displaced intra-articular fracture in the distal left radius. Distal radial fragments are displaced anteriorly. Oblique fracture noted through the distal left ulna. IMPRESSION: Distal left radial and ulnar fractures. Electronically Signed   By: Rolm Baptise M.D.   On: 10/19/2019 09:12    Procedures Procedures (including critical care time)  Medications Ordered in UC Medications - No data to display  Initial Impression / Assessment and Plan / UC Course  I have reviewed the triage vital signs and the nursing notes.  Pertinent labs & imaging results that were available during my care of the patient were reviewed by me and considered in my medical decision making (see chart for details).    Discussed xray results with Dr Lanny Cramp. Unfortunately don't have splinting supply in office. Called oncall hand office, Dr Levell July office, and was suggested to send patient to ED for further evaluation. Patient last ate last night, with coffee this morning at 7am. No other foods or drink since then. Patient driving herself to ED. Will defer oral pain meds and toradol injection until evaluation at ED.  Discussed with patient to avoid oral intake until cleared by provider in ED. Patient expresses understanding and agrees to plan. Patient discharged in stable condition to the ED for further evaluation needed.  Final Clinical Impressions(s) / UC Diagnoses   Final diagnoses:  Closed displaced fracture of head of left radius, initial encounter  Other closed fracture of distal end of left ulna, initial encounter    ED Prescriptions    None  PDMP not reviewed this encounter.   Ok Edwards, PA-C 10/19/19 1000

## 2019-10-19 NOTE — ED Triage Notes (Addendum)
Pt presents to [facility] for assessment after being pulled down by her dog and catching herself with her hands.  C/o left wrist pain with hematoma and open skin tear.  Denies LOC or head injury.

## 2019-10-19 NOTE — ED Notes (Signed)
Patient verbalizes understanding of discharge instructions. Opportunity for questioning and answers were provided. Armband removed by staff, pt discharged from ED ambulatory.   

## 2019-10-19 NOTE — Consult Note (Signed)
Reason for Consult:Left wrist fx Referring Physician: Gwenith Villanueva is an 63 y.o. female.  HPI: Karen Villanueva went out to walk her dog when it saw a cat and pulled her down last night. She put her hands out to break her fall and had immediate left wrist pain. She went to UC this morning where x-rays showed distal radius and ulna fxs. She was referred to ED for further evaluation. She c/o localized pain to the wrist. She is RHD.  Past Medical History:  Diagnosis Date  . ALS (amyotrophic lateral sclerosis) ([place])   . Celiac disease   . Osteoporosis     Past Surgical History:  Procedure Laterality Date  . ABDOMINAL HYSTERECTOMY  1994    Family History  Problem Relation Age of Onset  . Heart failure Mother   . Diabetes Father     Social History:  reports that she has quit smoking. She has never used smokeless tobacco. She reports previous alcohol use. She reports that she does not use drugs.  Allergies:  Allergies  Allergen Reactions  . Wheat Bran Diarrhea    Other reaction(s): Other (See Comments) Celiac Disease, NO GLUTEN     Medications: I have reviewed the patient's current medications.  No results found for this or any previous visit (from the past 48 hour(s)).  Dg Wrist Complete Left  Result Date: 10/19/2019 CLINICAL DATA:  Fall, left wrist pain EXAM: LEFT WRIST - COMPLETE 3+ VIEW COMPARISON:  09/08/2019 FINDINGS: There is a comminuted, displaced intra-articular fracture in the distal left radius. Distal radial fragments are displaced anteriorly. Oblique fracture noted through the distal left ulna. IMPRESSION: Distal left radial and ulnar fractures. Electronically Signed   By: Rolm Baptise M.D.   On: 10/19/2019 09:12    Review of Systems  Constitutional: Negative for weight loss.  HENT: Negative for ear discharge, ear pain, hearing loss and tinnitus.   Eyes: Negative for blurred vision, double vision, photophobia and pain.  Respiratory: Negative for cough, sputum  production and shortness of breath.   Cardiovascular: Negative for chest pain.  Gastrointestinal: Negative for abdominal pain, nausea and vomiting.  Genitourinary: Negative for dysuria, flank pain, frequency and urgency.  Musculoskeletal: Positive for joint pain (Left wrist). Negative for back pain, falls, myalgias and neck pain.  Neurological: Negative for dizziness, tingling, sensory change, focal weakness, loss of consciousness and headaches.  Endo/Heme/Allergies: Does not bruise/bleed easily.  Psychiatric/Behavioral: Negative for depression, memory loss and substance abuse. The patient is not nervous/anxious.    Blood pressure (!) 146/77, pulse 84, temperature 98.2 F (36.8 C), temperature source Oral, resp. rate 18, height 5\' 8"  (1.727 m), weight 74.8 kg, SpO2 100 %. Physical Exam  Constitutional: She appears well-developed and well-nourished. No distress.  HENT:  Head: Normocephalic and atraumatic.  Eyes: Conjunctivae are normal. Right eye exhibits no discharge. Left eye exhibits no discharge. No scleral icterus.  Neck: Normal range of motion.  Cardiovascular: Normal rate and regular rhythm.  Respiratory: Effort normal. No respiratory distress.  Musculoskeletal:     Comments: Left shoulder, elbow, wrist, digits- Mild abrasions dorsum of hand and FA dorsum, these do not probe deep, mod TTP wrist, no obvious deformity, no instability, no blocks to motion  Sens  Ax/R/M/U intact  Mot   Ax/ R/ PIN/ M/ AIN/ U intact but weak (some 2/2 ALS)  Rad 2+  Neurological: She is alert.  Skin: Skin is warm and dry. She is not diaphoretic.  Psychiatric: She has a normal  mood and affect. Her behavior is normal.    Assessment/Plan: Left wrist fx -- Plan for ORIF by Dr. Fredna Dow as OP. Sugar tong splint in meantime.  ALS Celiac disease    Karen Abu, PA-C Orthopedic Surgery [phone] 10/19/2019, 2:16 PM

## 2019-10-19 NOTE — ED Triage Notes (Signed)
Pt presents with a confirmed wrist fx and was advised by UC at [facility] to come into the ED for further evaluation.

## 2019-10-19 NOTE — Discharge Instructions (Addendum)
Please read attached information. If you experience any new or worsening signs or symptoms please return to the emergency room for evaluation. Please follow-up with your primary care provider or specialist as discussed. Please use medication prescribed only as directed and discontinue taking if you have any concerning signs or symptoms.   °

## 2019-10-19 NOTE — ED Notes (Signed)
Patient able to ambulate independently  

## 2019-10-19 NOTE — ED Provider Notes (Signed)
[facility] EMERGENCY DEPARTMENT Provider Note   CSN: [id number] Arrival date & time: 10/19/19  1007     History   Chief Complaint Chief Complaint  Patient presents with  . Broken Wrist    HPI Karen Villanueva is a 63 y.o. female.     HPI   63 year old female status post fall.  Patient is she is right-hand dominant was walking her dog yesterday when the dog pulled her down, she landed on outstretched hand and felt pain to her left wrist and hand.  Patient notes that she has cuts over the dorsal aspect of the left wrist and hand.  She went to urgent care this morning was told she has fractures at her wrist and sent over here.  She notes her last tetanus shot was within the last year, she denies any loss of sensation but notes she has difficulty moving her fingers and wrist.  She has taken tramadol last night, notes having coffee this morning around 7 but no solid foods.  She does a past medical history of ALS and does have some decreased range of motion and strength in her hands and fingers at baseline.   Past Medical History:  Diagnosis Date  . ALS (amyotrophic lateral sclerosis) ([place])   . Celiac disease   . Osteoporosis     Patient Active Problem List   Diagnosis Date Noted  . Hip pain 09/28/2019  . Hyperlipidemia 09/28/2019  . 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 Pathological fracture 09/28/2019  . Venous stasis 09/28/2019  . Medicare annual wellness visit, subsequent 06/21/2019  . ALS (amyotrophic lateral sclerosis) ([place]) 03/21/2019  . Osteoporosis 03/21/2019  . Bilateral arm weakness 10/16/2013  . Bilateral leg weakness 10/16/2013  . Dysarthria 10/16/2013  . Dysphagia 10/16/2013  . Fall from standing 03/22/2013  . Subtrochanteric fracture of right femur ([place]) 03/20/2013  . Celiac disease 03/29/2012    Past Surgical History:  Procedure Laterality Date  . ABDOMINAL HYSTERECTOMY  1994     OB History   No obstetric history on file.      Home  Medications    Prior to Admission medications   Medication Sig Start Date End Date Taking? Authorizing Provider  alendronate (FOSAMAX) 70 MG tablet Take 1 tablet (70 mg total) by mouth every 7 (seven) days. Take with a full glass of water on an empty stomach. 03/21/19   Danford, Valetta Fuller D, NP  doxycycline (VIBRAMYCIN) 100 MG capsule Take 1 capsule (100 mg total) by mouth 2 (two) times daily. 10/19/19   Kourtnei Rauber, Dellis Filbert, PA-C  HYDROcodone-acetaminophen (NORCO/VICODIN) 5-325 MG tablet Take 1 tablet by mouth every 6 (six) hours as needed. 10/19/19   Arye Weyenberg, Dellis Filbert, PA-C  Melatonin 5 MG TABS Take 5 mg by mouth at bedtime.    [provider]  traMADol (ULTRAM) 50 MG tablet Take 1 tablet (50 mg total) by mouth every 6 (six) hours as needed. 09/08/19   Fredia Sorrow, MD    Family History Family History  Problem Relation Age of Onset  . Heart failure Mother   . Diabetes Father     Social History Social History   Tobacco Use  . Smoking status: Former Research scientist (life sciences)  . Smokeless tobacco: Never Used  Substance Use Topics  . Alcohol use: Not Currently  . Drug use: Never     Allergies   Wheat bran   Review of Systems Review of Systems  All other systems reviewed and are negative.   Physical Exam Updated Vital  Signs BP (!) 154/85   Pulse 67   Temp 98.2 F (36.8 C) (Oral)   Resp 16   Ht 5\' 8"  (1.727 m)   Wt 74.8 kg   LMP  (Exact Date)   SpO2 100%   BMI 25.09 kg/m   Physical Exam Vitals signs and nursing note reviewed.  Constitutional:      Appearance: She is well-developed.  HENT:     Head: Normocephalic and atraumatic.  Eyes:     General: No scleral icterus.       Right eye: No discharge.        Left eye: No discharge.     Conjunctiva/sclera: Conjunctivae normal.     Pupils: Pupils are equal, round, and reactive to light.  Neck:     Musculoskeletal: Normal range of motion.     Vascular: No JVD.     Trachea: No tracheal deviation.  Pulmonary:     Effort:  Pulmonary effort is normal.     Breath sounds: No stridor.  Musculoskeletal:     Comments: Obvious swelling and deformity at the left distal wrist with wounds over the dorsal aspect of the wrist and hand-sensation intact throughout the hand diffusely with bruising noted-patient unable to fully range any of her fingers or extend wrist  Neurological:     Mental Status: She is alert and oriented to person, place, and time.     Coordination: Coordination normal.  Psychiatric:        Behavior: Behavior normal.        Thought Content: Thought content normal.        Judgment: Judgment normal.      ED Treatments / Results  Labs (all labs ordered are listed, but only abnormal results are displayed) Labs Reviewed  SARS CORONAVIRUS 2 BY RT PCR (HOSPITAL ORDER, [facility])    EKG None  Radiology Dg Wrist Complete Left  Result Date: 10/19/2019 CLINICAL DATA:  Fall, left wrist pain EXAM: LEFT WRIST - COMPLETE 3+ VIEW COMPARISON:  09/08/2019 FINDINGS: There is a comminuted, displaced intra-articular fracture in the distal left radius. Distal radial fragments are displaced anteriorly. Oblique fracture noted through the distal left ulna. IMPRESSION: Distal left radial and ulnar fractures. Electronically Signed   By: Rolm Baptise M.D.   On: 10/19/2019 09:12    Procedures Procedures (including critical care time)  Medications Ordered in ED Medications  morphine 4 MG/ML injection 4 mg (4 mg Intravenous Given 10/19/19 1204)  ceFAZolin (ANCEF) IVPB 2g/100 mL premix (0 g Intravenous Stopped 10/19/19 1321)  HYDROcodone-acetaminophen (NORCO/VICODIN) 5-325 MG per tablet 1 tablet (1 tablet Oral Given 10/19/19 1544)     Initial Impression / Assessment and Plan / ED Course  I have reviewed the triage vital signs and the nursing notes.  Pertinent labs & imaging results that were available during my care of the patient were reviewed by me and considered in my medical  decision making (see chart for details).        63 year old female presents status post fall.  Patient has what appears to be significant fracture to the distal ulna and radius with wounds overlying the distal wrist concerning for open fracture.  Her tetanus is up-to-date, IV will be started pain medication, prophylactic antibiotics and orthopedics consulted for further evaluation and management.  After pain medicine wounds were more thoroughly evaluated these appear to be superficial.  Patient evaluated by orthopedics who recommends splinting and outpatient follow-up.  Patient discharged with pain  medicine outpatient follow-up and return precautions.  Verbalized understanding and agreement to today's plan had no further questions or concerns at time of discharge.  Final Clinical Impressions(s) / ED Diagnoses   Final diagnoses:  Closed fracture of left wrist, initial encounter    ED Discharge Orders         Ordered    doxycycline (VIBRAMYCIN) 100 MG capsule  2 times daily     10/19/19 1539    HYDROcodone-acetaminophen (NORCO/VICODIN) 5-325 MG tablet  Every 4 hours PRN,   Status:  Discontinued     10/19/19 1539    HYDROcodone-acetaminophen (NORCO/VICODIN) 5-325 MG tablet  Every 6 hours PRN     10/19/19 1540           Okey Regal, PA-C 10/19/19 1851    Dorie Rank, MD 10/20/19 [phone]

## 2019-10-19 NOTE — Progress Notes (Signed)
Orthopedic Tech Progress Note Patient Details:  Karen Villanueva 03-May-1956 [id number]  Ortho Devices Type of Ortho Device: Ace wrap, Arm sling, Sugartong splint Ortho Device/Splint Location: left Ortho Device/Splint Interventions: Application   Post Interventions Patient Tolerated: Well Instructions Provided: Care of device   [name] 10/19/2019, 2:58 PM

## 2019-10-19 NOTE — ED Notes (Signed)
Ring has been removed from affected hand. Ring given to pt.

## 2019-10-19 NOTE — Discharge Instructions (Signed)
Given xray results and without splinting supply, Dr Levell July office suggested evaluation at the ED. Please do not eat or drink anything until they clear you.

## 2019-10-23 DIAGNOSIS — S52572A Other intraarticular fracture of lower end of left radius, initial encounter for closed fracture: Secondary | ICD-10-CM | POA: Diagnosis not present

## 2019-10-23 DIAGNOSIS — S52692A Other fracture of lower end of left ulna, initial encounter for closed fracture: Secondary | ICD-10-CM | POA: Diagnosis not present

## 2019-10-23 DIAGNOSIS — M25531 Pain in right wrist: Secondary | ICD-10-CM | POA: Diagnosis not present

## 2019-10-24 ENCOUNTER — Other Ambulatory Visit: Payer: Self-pay

## 2019-10-24 ENCOUNTER — Encounter (HOSPITAL_BASED_OUTPATIENT_CLINIC_OR_DEPARTMENT_OTHER): Payer: Self-pay | Admitting: *Deleted

## 2019-10-25 ENCOUNTER — Other Ambulatory Visit: Payer: Self-pay | Admitting: Orthopedic Surgery

## 2019-10-27 ENCOUNTER — Other Ambulatory Visit (HOSPITAL_COMMUNITY)
Admission: RE | Admit: 2019-10-27 | Discharge: 2019-10-27 | Disposition: A | Discharge status: Home / Self Care | Payer: Medicare HMO | Source: Ambulatory Visit | Attending: Orthopedic Surgery | Admitting: Orthopedic Surgery

## 2019-10-27 DIAGNOSIS — Z01812 Encounter for preprocedural laboratory examination: Secondary | ICD-10-CM | POA: Diagnosis not present

## 2019-10-27 DIAGNOSIS — Z20828 Contact with and (suspected) exposure to other viral communicable diseases: Secondary | ICD-10-CM | POA: Insufficient documentation

## 2019-10-28 LAB — NOVEL CORONAVIRUS, NAA (HOSP ORDER, SEND-OUT TO REF LAB; TAT 18-24 HRS): SARS-CoV-2, NAA: NOT DETECTED

## 2019-10-31 ENCOUNTER — Ambulatory Visit (HOSPITAL_BASED_OUTPATIENT_CLINIC_OR_DEPARTMENT_OTHER): Payer: Medicare HMO | Admitting: Anesthesiology

## 2019-10-31 ENCOUNTER — Ambulatory Visit (HOSPITAL_BASED_OUTPATIENT_CLINIC_OR_DEPARTMENT_OTHER)
Admission: RE | Admit: 2019-10-31 | Discharge: 2019-10-31 | Disposition: A | Discharge status: Home / Self Care | Payer: Medicare HMO | Attending: Orthopedic Surgery | Admitting: Orthopedic Surgery

## 2019-10-31 ENCOUNTER — Encounter (HOSPITAL_BASED_OUTPATIENT_CLINIC_OR_DEPARTMENT_OTHER)
Admission: RE | Disposition: A | Discharge status: Home / Self Care | Payer: Self-pay | Source: Home / Self Care | Attending: Orthopedic Surgery

## 2019-10-31 ENCOUNTER — Encounter (HOSPITAL_BASED_OUTPATIENT_CLINIC_OR_DEPARTMENT_OTHER): Payer: Self-pay

## 2019-10-31 ENCOUNTER — Other Ambulatory Visit: Payer: Self-pay

## 2019-10-31 DIAGNOSIS — W541XXA Struck by dog, initial encounter: Secondary | ICD-10-CM | POA: Insufficient documentation

## 2019-10-31 DIAGNOSIS — S52202A Unspecified fracture of shaft of left ulna, initial encounter for closed fracture: Secondary | ICD-10-CM | POA: Insufficient documentation

## 2019-10-31 DIAGNOSIS — G1221 Amyotrophic lateral sclerosis: Secondary | ICD-10-CM | POA: Insufficient documentation

## 2019-10-31 DIAGNOSIS — S52572A Other intraarticular fracture of lower end of left radius, initial encounter for closed fracture: Secondary | ICD-10-CM | POA: Diagnosis not present

## 2019-10-31 DIAGNOSIS — E785 Hyperlipidemia, unspecified: Secondary | ICD-10-CM | POA: Diagnosis not present

## 2019-10-31 DIAGNOSIS — S52602A Unspecified fracture of lower end of left ulna, initial encounter for closed fracture: Secondary | ICD-10-CM | POA: Diagnosis not present

## 2019-10-31 DIAGNOSIS — M810 Age-related osteoporosis without current pathological fracture: Secondary | ICD-10-CM | POA: Insufficient documentation

## 2019-10-31 HISTORY — PX: OPEN REDUCTION INTERNAL FIXATION (ORIF) DISTAL RADIAL FRACTURE: SHX5989

## 2019-10-31 HISTORY — DX: Unspecified fracture of shaft of left ulna, initial encounter for closed fracture: S52.202A

## 2019-10-31 HISTORY — DX: Unspecified fracture of the lower end of left radius, initial encounter for closed fracture: S52.502A

## 2019-10-31 SURGERY — OPEN REDUCTION INTERNAL FIXATION (ORIF) DISTAL RADIUS FRACTURE
Anesthesia: Regional | Site: Wrist | Laterality: Left

## 2019-10-31 MED ORDER — 0.9 % SODIUM CHLORIDE (POUR BTL) OPTIME
TOPICAL | Status: DC | PRN
  Administered 2019-10-31: 200 mL

## 2019-10-31 MED ORDER — PROPOFOL 500 MG/50ML IV EMUL
INTRAVENOUS | Status: DC | PRN
  Administered 2019-10-31: 50 ug/kg/min via INTRAVENOUS

## 2019-10-31 MED ORDER — FENTANYL CITRATE (PF) 100 MCG/2ML IJ SOLN: 25.0000 ug | INTRAMUSCULAR | Status: DC | PRN

## 2019-10-31 MED ORDER — MIDAZOLAM HCL 2 MG/2ML IJ SOLN
INTRAMUSCULAR | Status: AC
  Filled 2019-10-31: qty 2

## 2019-10-31 MED ORDER — METOCLOPRAMIDE HCL 5 MG/ML IJ SOLN: 10.0000 mg | Freq: Once | INTRAMUSCULAR | Status: DC | PRN

## 2019-10-31 MED ORDER — HYDROCODONE-ACETAMINOPHEN 5-325 MG PO TABS: ORAL_TABLET | ORAL | 0 refills | Status: DC

## 2019-10-31 MED ORDER — FENTANYL CITRATE (PF) 100 MCG/2ML IJ SOLN
INTRAMUSCULAR | Status: AC
  Filled 2019-10-31: qty 2

## 2019-10-31 MED ORDER — CEFAZOLIN SODIUM-DEXTROSE 2-4 GM/100ML-% IV SOLN
2.0000 g | INTRAVENOUS | Status: AC
  Administered 2019-10-31: 2 g via INTRAVENOUS

## 2019-10-31 MED ORDER — LACTATED RINGERS IV SOLN
INTRAVENOUS | Status: DC
  Administered 2019-10-31: 13:00:00 via INTRAVENOUS

## 2019-10-31 MED ORDER — CHLORHEXIDINE GLUCONATE 4 % EX LIQD: 60.0000 mL | Freq: Once | CUTANEOUS | Status: DC

## 2019-10-31 MED ORDER — ONDANSETRON HCL 4 MG/2ML IJ SOLN
INTRAMUSCULAR | Status: DC | PRN
  Administered 2019-10-31: 4 mg via INTRAVENOUS

## 2019-10-31 MED ORDER — MEPERIDINE HCL 25 MG/ML IJ SOLN: 6.2500 mg | INTRAMUSCULAR | Status: DC | PRN

## 2019-10-31 MED ORDER — MIDAZOLAM HCL 2 MG/2ML IJ SOLN
1.0000 mg | INTRAMUSCULAR | Status: DC | PRN
  Administered 2019-10-31: 2 mg via INTRAVENOUS

## 2019-10-31 MED ORDER — PROPOFOL 10 MG/ML IV BOLUS
INTRAVENOUS | Status: AC
  Filled 2019-10-31: qty 20

## 2019-10-31 MED ORDER — ROPIVACAINE HCL 7.5 MG/ML IJ SOLN
INTRAMUSCULAR | Status: DC | PRN
  Administered 2019-10-31: 20 mL via PERINEURAL

## 2019-10-31 MED ORDER — LACTATED RINGERS IV SOLN: INTRAVENOUS | Status: DC

## 2019-10-31 MED ORDER — FENTANYL CITRATE (PF) 100 MCG/2ML IJ SOLN
50.0000 ug | INTRAMUSCULAR | Status: DC | PRN
  Administered 2019-10-31: 100 ug via INTRAVENOUS

## 2019-10-31 MED ORDER — ONDANSETRON HCL 4 MG/2ML IJ SOLN
INTRAMUSCULAR | Status: AC
  Filled 2019-10-31: qty 2

## 2019-10-31 MED ORDER — DEXAMETHASONE SODIUM PHOSPHATE 10 MG/ML IJ SOLN
INTRAMUSCULAR | Status: AC
  Filled 2019-10-31: qty 1

## 2019-10-31 MED ORDER — CEFAZOLIN SODIUM-DEXTROSE 2-4 GM/100ML-% IV SOLN
INTRAVENOUS | Status: AC
  Filled 2019-10-31: qty 100

## 2019-10-31 SURGICAL SUPPLY — 64 items
APL PRP STRL LF DISP 70% ISPRP (MISCELLANEOUS) ×1
BIT DRILL 2.0 LNG QUCK RELEASE (BIT) IMPLANT
BIT DRILL 2.8 QUICK RELEASE (BIT) IMPLANT
BLADE SURG 15 STRL LF DISP TIS (BLADE) ×2 IMPLANT
BLADE SURG 15 STRL SS (BLADE) ×4
BNDG CMPR 9X4 STRL LF SNTH (GAUZE/BANDAGES/DRESSINGS) ×1
BNDG ELASTIC 3X5.8 VLCR STR LF (GAUZE/BANDAGES/DRESSINGS) ×2 IMPLANT
BNDG ESMARK 4X9 LF (GAUZE/BANDAGES/DRESSINGS) ×2 IMPLANT
BNDG GAUZE ELAST 4 BULKY (GAUZE/BANDAGES/DRESSINGS) ×2 IMPLANT
BNDG PLASTER X FAST 3X3 WHT LF (CAST SUPPLIES) ×40 IMPLANT
BNDG PLSTR 9X3 FST ST WHT (CAST SUPPLIES) ×30
CHLORAPREP W/TINT 26 (MISCELLANEOUS) ×2 IMPLANT
CORD BIPOLAR FORCEPS 12FT (ELECTRODE) ×2 IMPLANT
COVER BACK TABLE REUSABLE LG (DRAPES) ×2 IMPLANT
COVER MAYO STAND REUSABLE (DRAPES) ×2 IMPLANT
COVER WAND RF STERILE (DRAPES) IMPLANT
CUFF TOURN SGL QUICK 18X4 (TOURNIQUET CUFF) ×1 IMPLANT
CUFF TOURN SGL QUICK 24 (TOURNIQUET CUFF)
CUFF TRNQT CYL 24X4X16.5-23 (TOURNIQUET CUFF) IMPLANT
DRAPE EXTREMITY T 121X128X90 (DISPOSABLE) ×2 IMPLANT
DRAPE OEC MINIVIEW 54X84 (DRAPES) ×2 IMPLANT
DRAPE SURG 17X23 STRL (DRAPES) ×2 IMPLANT
DRILL 2.0 LNG QUICK RELEASE (BIT) ×2
DRILL 2.8 QUICK RELEASE (BIT) ×2
GAUZE SPONGE 4X4 12PLY STRL (GAUZE/BANDAGES/DRESSINGS) ×2 IMPLANT
GAUZE XEROFORM 1X8 LF (GAUZE/BANDAGES/DRESSINGS) ×2 IMPLANT
GLOVE BIO SURGEON STRL SZ 6.5 (GLOVE) ×1 IMPLANT
GLOVE BIO SURGEON STRL SZ7.5 (GLOVE) ×2 IMPLANT
GLOVE BIOGEL PI IND STRL 6.5 (GLOVE) IMPLANT
GLOVE BIOGEL PI IND STRL 7.0 (GLOVE) IMPLANT
GLOVE BIOGEL PI IND STRL 8 (GLOVE) ×1 IMPLANT
GLOVE BIOGEL PI IND STRL 8.5 (GLOVE) IMPLANT
GLOVE BIOGEL PI INDICATOR 6.5 (GLOVE) ×1
GLOVE BIOGEL PI INDICATOR 7.0 (GLOVE) ×1
GLOVE BIOGEL PI INDICATOR 8 (GLOVE) ×1
GLOVE BIOGEL PI INDICATOR 8.5 (GLOVE) ×1
GLOVE SURG ORTHO 8.0 STRL STRW (GLOVE) ×1 IMPLANT
GOWN STRL REUS W/ TWL LRG LVL3 (GOWN DISPOSABLE) ×1 IMPLANT
GOWN STRL REUS W/TWL LRG LVL3 (GOWN DISPOSABLE) ×2
GOWN STRL REUS W/TWL XL LVL3 (GOWN DISPOSABLE) ×3 IMPLANT
GUIDEWIRE ORTHO 0.054X6 (WIRE) ×3 IMPLANT
NDL HYPO 25X1 1.5 SAFETY (NEEDLE) IMPLANT
NEEDLE HYPO 25X1 1.5 SAFETY (NEEDLE) IMPLANT
NS IRRIG 1000ML POUR BTL (IV SOLUTION) ×2 IMPLANT
PACK BASIN DAY SURGERY FS (CUSTOM PROCEDURE TRAY) ×2 IMPLANT
PAD CAST 3X4 CTTN HI CHSV (CAST SUPPLIES) ×1 IMPLANT
PADDING CAST COTTON 3X4 STRL (CAST SUPPLIES) ×2
PLATE ACU LOC PROX STD LEFT (Plate) ×1 IMPLANT
SCREW ACTK 2 NL HEX 3.5.11 (Screw) ×1 IMPLANT
SCREW CORT FT 18X2.3XLCK HEX (Screw) IMPLANT
SCREW CORTICAL LOCKING 2.3X18M (Screw) ×4 IMPLANT
SCREW CORTICAL LOCKING 2.3X20M (Screw) ×2 IMPLANT
SCREW CORTICAL LOCKING 2.3X22M (Screw) ×6 IMPLANT
SCREW FX20X2.3XSMTH LCK NS CRT (Screw) IMPLANT
SCREW FX22X2.3XLCK SMTH NS CRT (Screw) IMPLANT
SCREW NONLOCK HEX 3.5X12 (Screw) ×2 IMPLANT
SLEEVE SCD COMPRESS KNEE MED (MISCELLANEOUS) IMPLANT
STOCKINETTE 4X48 STRL (DRAPES) ×2 IMPLANT
SUT ETHILON 4 0 PS 2 18 (SUTURE) ×3 IMPLANT
SUT VICRYL 4-0 PS2 18IN ABS (SUTURE) ×3 IMPLANT
SYR BULB 3OZ (MISCELLANEOUS) ×2 IMPLANT
SYR CONTROL 10ML LL (SYRINGE) IMPLANT
TOWEL GREEN STERILE FF (TOWEL DISPOSABLE) ×4 IMPLANT
UNDERPAD 30X36 HEAVY ABSORB (UNDERPADS AND DIAPERS) ×2 IMPLANT

## 2019-10-31 NOTE — Anesthesia Procedure Notes (Signed)
Anesthesia Regional Block: Supraclavicular block   Pre-Anesthetic Checklist: ,, timeout performed, Correct Patient, Correct Site, Correct Laterality, Correct Procedure, Correct Position, site marked, Risks and benefits discussed,  Surgical consent,  Pre-op evaluation,  At surgeon's request and post-op pain management  Laterality: Left and Upper  Prep: Maximum Sterile Barrier Precautions used, chloraprep       Needles:  Injection technique: Single-shot  Needle Type: Echogenic Stimulator Needle     Needle Length: 10cm      Additional Needles:   Procedures:,,,, ultrasound used (permanent image in chart),,,,  Narrative:  Start time: 10/31/2019 12:19 PM End time: 10/31/2019 12:29 PM Injection made incrementally with aspirations every 5 mL.  Performed by: Personally  Anesthesiologist: Montez Hageman, MD  Additional Notes: Risks, benefits and alternative to block explained extensively.  Patient tolerated procedure well, without complications.

## 2019-10-31 NOTE — Transfer of Care (Signed)
Immediate Anesthesia Transfer of Care Note  Patient: Karen Villanueva  Procedure(s) Performed: OPEN REDUCTION INTERNAL FIXATION (ORIF) LEFT DISTAL RADIAL FRACTURE,  CLOSED REDUCTION LEFT ULNA (Left Wrist)  Patient Location: PACU  Anesthesia Type:MAC combined with regional for post-op pain  Level of Consciousness: awake, alert  and oriented  Airway & Oxygen Therapy: Patient Spontanous Breathing and Patient connected to face mask oxygen  Post-op Assessment: Report given to RN and Post -op Vital signs reviewed and stable  Post vital signs: Reviewed and stable  Last Vitals:  Vitals Value Taken Time  BP 129/80 10/31/19 1430  Temp    Pulse 57 10/31/19 1431  Resp 14 10/31/19 1431  SpO2 94 % 10/31/19 1431  Vitals shown include unvalidated device data.  Last Pain:  Vitals:   10/31/19 1201  TempSrc: Oral  PainSc: 0-No pain         Complications: No apparent anesthesia complications

## 2019-10-31 NOTE — Anesthesia Preprocedure Evaluation (Addendum)
Anesthesia Evaluation  Patient identified by MRN, date of birth, ID band Patient awake    Reviewed: Allergy & Precautions, NPO status , Patient's Chart, lab work & pertinent test results  Airway Mallampati: II  TM Distance: >3 FB Neck ROM: Full    Dental no notable dental hx.    Pulmonary neg pulmonary ROS, former smoker,    Pulmonary exam normal breath sounds clear to auscultation       Cardiovascular negative cardio ROS Normal cardiovascular exam Rhythm:Regular Rate:Normal     Neuro/Psych  Neuromuscular disease (ALS) negative psych ROS   GI/Hepatic negative GI ROS, Neg liver ROS,   Endo/Other  negative endocrine ROS  Renal/GU negative Renal ROS  negative genitourinary   Musculoskeletal negative musculoskeletal ROS (+)   Abdominal   Peds negative pediatric ROS (+)  Hematology negative hematology ROS (+)   Anesthesia Other Findings   Reproductive/Obstetrics negative OB ROS                             Anesthesia Physical Anesthesia Plan  ASA: II  Anesthesia Plan: Regional   Post-op Pain Management:    Induction: Intravenous  PONV Risk Score and Plan: 2 and Ondansetron, Dexamethasone and Treatment may vary due to age or medical condition  Airway Management Planned: Nasal Cannula and Simple Face Mask  Additional Equipment:   Intra-op Plan:   Post-operative Plan: Extubation in OR  Informed Consent: I have reviewed the patients History and Physical, chart, labs and discussed the procedure including the risks, benefits and alternatives for the proposed anesthesia with the patient or authorized representative who has indicated his/her understanding and acceptance.     Dental advisory given  Plan Discussed with: CRNA  Anesthesia Plan Comments:        Anesthesia Quick Evaluation

## 2019-10-31 NOTE — Op Note (Signed)
I assisted Surgeon(s) and Role:    * Leanora Cover, MD - Primary    Daryll Brod, MD on the Procedure(s): OPEN REDUCTION INTERNAL FIXATION (ORIF) LEFT DISTAL RADIAL FRACTURE,  CLOSED REDUCTION LEFT ULNA on 10/31/2019.  I provided assistance on this case as follows: setup, approach, debridement, reduction, stabilization, fixation of the fracture, closure of the wound and application of the dressings and splint.  Electronically signed by: Daryll Brod, MD Date: 10/31/2019 Time: 2:25 PM

## 2019-10-31 NOTE — Discharge Instructions (Addendum)

## 2019-10-31 NOTE — H&P (Signed)
  Karen Villanueva is an 63 y.o. female.   Chief Complaint: left distal radius and ulna fractures HPI: 63 yo feamle states she was knocked over by her dog ~1.5 weeks ago injuring left wrist.  XR show distal radius and ulna fractures,  shw eishes to proceed with operative fixation.  Allergies:  Allergies  Allergen Reactions  . Wheat Bran Diarrhea    Other reaction(s): Other (See Comments) Celiac Disease, NO GLUTEN     Past Medical History:  Diagnosis Date  . ALS (amyotrophic lateral sclerosis) ([place])   . Celiac disease   . Closed fracture of left distal radius   . Left ulnar fracture    left  . Osteoporosis     Past Surgical History:  Procedure Laterality Date  . ABDOMINAL HYSTERECTOMY  1994  . FEMUR FRACTURE SURGERY Right     Family History: Family History  Problem Relation Age of Onset  . Heart failure Mother   . Diabetes Father     Social History:   reports that she has quit smoking. She has never used smokeless tobacco. She reports previous alcohol use. She reports that she does not use drugs.  Medications: Medications Prior to Admission  Medication Sig Dispense Refill  . alendronate (FOSAMAX) 70 MG tablet Take 1 tablet (70 mg total) by mouth every 7 (seven) days. Take with a full glass of water on an empty stomach. 4 tablet 11  . doxycycline (VIBRAMYCIN) 100 MG capsule Take 1 capsule (100 mg total) by mouth 2 (two) times daily. 14 capsule 0  . HYDROcodone-acetaminophen (NORCO/VICODIN) 5-325 MG tablet Take 1 tablet by mouth every 6 (six) hours as needed. 15 tablet 0  . ibuprofen (ADVIL) 400 MG tablet Take 400 mg by mouth every 6 (six) hours as needed.    . Melatonin 5 MG TABS Take 5 mg by mouth at bedtime.      No results found for this or any previous visit (from the past 48 hour(s)).  No results found.   A comprehensive review of systems was negative.  Blood pressure 140/84, pulse (!) 55, temperature 98 F (36.7 C), temperature source Oral, resp. rate 11,  height 5\' 8"  (1.727 m), weight 75.3 kg, SpO2 100 %.  General appearance: alert, cooperative and appears stated age Head: Normocephalic, without obvious abnormality, atraumatic Neck: supple, symmetrical, trachea midline Cardio: regular rate and rhythm Resp: clear to auscultation bilaterally Extremities: Intact sensation and capillary refill all digits.  +epl/fpl/io.  No wounds.  Pulses: 2+ and symmetric Skin: Skin color, texture, turgor normal. No rashes or lesions Neurologic: Grossly normal Incision/Wound: none  Assessment/Plan Left distal radius and ulna fractures.  Non operative and operative treatment options have been discussed with the patient and patient wishes to proceed with operative treatment. Risks, benefits, and alternatives of surgery have been discussed and the patient agrees with the plan of care.   Leanora Cover 10/31/2019, 1:00 PM

## 2019-10-31 NOTE — Anesthesia Postprocedure Evaluation (Signed)
Anesthesia Post Note  Patient: Karen Villanueva  Procedure(s) Performed: OPEN REDUCTION INTERNAL FIXATION (ORIF) LEFT DISTAL RADIAL FRACTURE,  CLOSED REDUCTION LEFT ULNA (Left Wrist)     Patient location during evaluation: PACU Anesthesia Type: Regional Level of consciousness: awake and alert Pain management: pain level controlled Vital Signs Assessment: post-procedure vital signs reviewed and stable Respiratory status: spontaneous breathing, nonlabored ventilation, respiratory function stable and patient connected to nasal cannula oxygen Cardiovascular status: stable and blood pressure returned to baseline Postop Assessment: no apparent nausea or vomiting Anesthetic complications: no    Last Vitals:  Vitals:   10/31/19 1500 10/31/19 1530  BP: (!) 151/85 (!) (P) 156/68  Pulse: (!) 57 (P) 60  Resp: 18 (P) 16  Temp:  (P) 36.7 C  SpO2: 95% (P) 97%    Last Pain:  Vitals:   10/31/19 1530  TempSrc:   PainSc: (P) 0-No pain                 Montez Hageman

## 2019-10-31 NOTE — Op Note (Addendum)
10/31/2019 [facility]  Operative Note  Pre Op Diagnosis: Left comminuted intraarticular distal radius fracture  Post Op Diagnosis: Left comminuted intraarticular distal radius fracture  Procedure:  1. ORIF Left comminuted intraarticular distal radius fracture, 3 intraarticular fragments 2. Closed treatment left distal ulna shaft fracture without manipulation 3. Left brachioradialis release  Surgeon: Leanora Cover, MD  Assistant: Daryll Brod, MD  Anesthesia: Regional with sedation  Fluids: Per anesthesia flow sheet  EBL: minimal  Complications: None  Specimen: None  Tourniquet Time:  Total Tourniquet Time Documented: Upper Arm (Left) - 51 minutes Total: Upper Arm (Left) - 51 minutes   Disposition: Stable to PACU  INDICATIONS:  Karen Villanueva is a 63 y.o. female states she fell 1.5 weeks ago injuring left wrist.  XR show distal radius fracture and distal ulnar shaft fracture.  We discussed nonoperative and operative treatment options.  She wished to proceed with operative fixation.  Risks, benefits, and alternatives of surgery were discussed including the risk of blood loss; infection; damage to nerves, vessels, tendons, ligaments, bone; failure of surgery; need for additional surgery; complications with wound healing; continued pain; nonunion; malunion; stiffness.  We also discussed the possible need for bone graft and the benefits and risks including the possibility of disease transmission.  She voiced understanding of these risks and elected to proceed.    OPERATIVE COURSE:  After being identified preoperatively by myself, the patient and I agreed upon the procedure and site of procedure.  Surgical site was marked.  The risks, benefits and alternatives of the surgery were reviewed and she wished to proceed.  Surgical consent had been signed.  She was given IV Ancef as preoperative antibiotic prophylaxis.  She was transferred to the operating room and placed on the  operating room table in supine position with the Left upper extremity on an armboard. Sedation was induced by the anesthesiologist.  A regional block had been performed by anesthesia in preoperative holding.  The Left upper extremity was prepped and draped in normal sterile orthopedic fashion.  A surgical pause was performed between the surgeons, anesthesia and operating room staff, and all were in agreement as to the patient, procedure and site of procedure.  Tourniquet at the proximal aspect of the extremity was inflated to 250 mmHg after exsanguination of the limb with an Esmarch bandage.  Standard volar Mallie Mussel approach was used.  The bipolar electrocautery was used to obtain hemostasis.  The superficial and deep portions of the FCR tendon sheath were incised, and the FCR and FPL were swept ulnarly to protect the palmar cutaneous branch of the median nerve.  The brachioradialis was released at the radial side of the radius.  The pronator quadratus was released and elevated with the periosteal elevator.  The fracture site was identified and cleared of soft tissue interposition and hematoma.  It was reduced under direct visualization.  There was intraarticular extension creating at least three intraarticular fragments.  An [employer]uMed volar distal radial locking plate was selected.  It was secured to the bone with the guidepins.  C-arm was used in AP and lateral projections to ensure appropriate reduction and position of the hardware and adjustments made as necessary.  Standard AO drilling and measuring technique was used.  A single screw was placed in the slotted hole in the shaft of the plate.  The distal holes were filled with locking pegs with the exception of the styloid holes, which were filled with locking screws.  The remaining holes in  the shaft of the plate were filled with nonlocking screws.  Good purchase was obtained.  C-arm was used in AP, lateral and oblique projections to ensure appropriate reduction and  position of hardware, which was the case.  There was no intra-articular penetration of hardware.  The distal ulna fracture was in acceptable reduction and did not require manipulation.  The wound was copiously irrigated with sterile saline.  Pronator quadratus was repaired back over top of the plate using 4-0 Vicryl suture.  Vicryl suture was placed in the subcutaneous tissues in an inverted interrupted fashion and the skin was closed with 4-0 nylon in a horizontal mattress fashion.  There was good pronation and supination of the wrist without crepitance.  The wound was then dressed with sterile Xeroform, 4x4s, and wrapped with a Kerlix bandage.  A sugar tong splint was placed and wrapped with Kerlix and Ace bandage.  Tourniquet was deflated at 51 minutes.  Fingertips were pink with brisk capillary refill after deflation of the tourniquet.  Operative drapes were broken down.  The patient was awoken from anesthesia safely.  She was transferred back to the stretcher and taken to the PACU in stable condition.  I will see her back in the office in one week for postoperative followup.  I will give her a prescription for Norco 5/325 1-2 tabs PO q6 hours prn pain, dispense # 30.    Leanora Cover, MD Electronically signed, 10/31/19   Addendum (11/10/2019): Correct the type of splint placed.

## 2019-10-31 NOTE — Progress Notes (Signed)
Assisted Dr. Carignan with left, ultrasound guided, supraclavicular block. Side rails up, monitors on throughout procedure. See vital signs in flow sheet. Tolerated Procedure well. 

## 2019-11-01 NOTE — Addendum Note (Signed)
Addendum  created 11/01/19 1058 by Tawni Millers, CRNA   Charge Capture section accepted

## 2019-11-02 ENCOUNTER — Encounter (HOSPITAL_BASED_OUTPATIENT_CLINIC_OR_DEPARTMENT_OTHER): Payer: Self-pay | Admitting: Orthopedic Surgery

## 2019-11-06 DIAGNOSIS — M25632 Stiffness of left wrist, not elsewhere classified: Secondary | ICD-10-CM | POA: Diagnosis not present

## 2019-11-06 DIAGNOSIS — S52572D Other intraarticular fracture of lower end of left radius, subsequent encounter for closed fracture with routine healing: Secondary | ICD-10-CM | POA: Diagnosis not present

## 2019-11-06 DIAGNOSIS — M25532 Pain in left wrist: Secondary | ICD-10-CM | POA: Diagnosis not present

## 2019-11-06 DIAGNOSIS — S52692D Other fracture of lower end of left ulna, subsequent encounter for closed fracture with routine healing: Secondary | ICD-10-CM | POA: Diagnosis not present

## 2019-11-29 DIAGNOSIS — M25532 Pain in left wrist: Secondary | ICD-10-CM | POA: Diagnosis not present

## 2019-11-29 DIAGNOSIS — S52572D Other intraarticular fracture of lower end of left radius, subsequent encounter for closed fracture with routine healing: Secondary | ICD-10-CM | POA: Diagnosis not present

## 2019-11-29 DIAGNOSIS — M25632 Stiffness of left wrist, not elsewhere classified: Secondary | ICD-10-CM | POA: Diagnosis not present

## 2019-11-29 DIAGNOSIS — S52692D Other fracture of lower end of left ulna, subsequent encounter for closed fracture with routine healing: Secondary | ICD-10-CM | POA: Diagnosis not present

## 2020-01-09 DIAGNOSIS — S52572D Other intraarticular fracture of lower end of left radius, subsequent encounter for closed fracture with routine healing: Secondary | ICD-10-CM | POA: Diagnosis not present

## 2020-01-12 DIAGNOSIS — M25632 Stiffness of left wrist, not elsewhere classified: Secondary | ICD-10-CM | POA: Diagnosis not present

## 2020-01-12 DIAGNOSIS — S52572D Other intraarticular fracture of lower end of left radius, subsequent encounter for closed fracture with routine healing: Secondary | ICD-10-CM | POA: Diagnosis not present

## 2020-01-12 DIAGNOSIS — S52692D Other fracture of lower end of left ulna, subsequent encounter for closed fracture with routine healing: Secondary | ICD-10-CM | POA: Diagnosis not present

## 2020-01-12 DIAGNOSIS — M25532 Pain in left wrist: Secondary | ICD-10-CM | POA: Diagnosis not present

## 2020-01-12 DIAGNOSIS — G5632 Lesion of radial nerve, left upper limb: Secondary | ICD-10-CM | POA: Diagnosis not present

## 2020-01-17 DIAGNOSIS — M25632 Stiffness of left wrist, not elsewhere classified: Secondary | ICD-10-CM | POA: Diagnosis not present

## 2020-01-17 DIAGNOSIS — S52572D Other intraarticular fracture of lower end of left radius, subsequent encounter for closed fracture with routine healing: Secondary | ICD-10-CM | POA: Diagnosis not present

## 2020-01-17 DIAGNOSIS — S52692D Other fracture of lower end of left ulna, subsequent encounter for closed fracture with routine healing: Secondary | ICD-10-CM | POA: Diagnosis not present

## 2020-01-17 DIAGNOSIS — G5632 Lesion of radial nerve, left upper limb: Secondary | ICD-10-CM | POA: Diagnosis not present

## 2020-01-25 DIAGNOSIS — S52692D Other fracture of lower end of left ulna, subsequent encounter for closed fracture with routine healing: Secondary | ICD-10-CM | POA: Diagnosis not present

## 2020-01-25 DIAGNOSIS — M25532 Pain in left wrist: Secondary | ICD-10-CM | POA: Diagnosis not present

## 2020-01-25 DIAGNOSIS — S52572D Other intraarticular fracture of lower end of left radius, subsequent encounter for closed fracture with routine healing: Secondary | ICD-10-CM | POA: Diagnosis not present

## 2020-01-25 DIAGNOSIS — G5632 Lesion of radial nerve, left upper limb: Secondary | ICD-10-CM | POA: Diagnosis not present

## 2020-01-25 DIAGNOSIS — M25632 Stiffness of left wrist, not elsewhere classified: Secondary | ICD-10-CM | POA: Diagnosis not present

## 2020-02-02 DIAGNOSIS — S52692D Other fracture of lower end of left ulna, subsequent encounter for closed fracture with routine healing: Secondary | ICD-10-CM | POA: Diagnosis not present

## 2020-02-02 DIAGNOSIS — M25632 Stiffness of left wrist, not elsewhere classified: Secondary | ICD-10-CM | POA: Diagnosis not present

## 2020-02-02 DIAGNOSIS — G5632 Lesion of radial nerve, left upper limb: Secondary | ICD-10-CM | POA: Diagnosis not present

## 2020-02-02 DIAGNOSIS — S52572D Other intraarticular fracture of lower end of left radius, subsequent encounter for closed fracture with routine healing: Secondary | ICD-10-CM | POA: Diagnosis not present

## 2020-02-02 DIAGNOSIS — M25532 Pain in left wrist: Secondary | ICD-10-CM | POA: Diagnosis not present

## 2020-02-08 DIAGNOSIS — S52572D Other intraarticular fracture of lower end of left radius, subsequent encounter for closed fracture with routine healing: Secondary | ICD-10-CM | POA: Diagnosis not present

## 2020-02-08 DIAGNOSIS — S52692D Other fracture of lower end of left ulna, subsequent encounter for closed fracture with routine healing: Secondary | ICD-10-CM | POA: Diagnosis not present

## 2020-02-08 DIAGNOSIS — G5632 Lesion of radial nerve, left upper limb: Secondary | ICD-10-CM | POA: Diagnosis not present

## 2020-02-08 DIAGNOSIS — M25632 Stiffness of left wrist, not elsewhere classified: Secondary | ICD-10-CM | POA: Diagnosis not present

## 2020-02-08 DIAGNOSIS — M25532 Pain in left wrist: Secondary | ICD-10-CM | POA: Diagnosis not present

## 2020-02-17 ENCOUNTER — Encounter: Payer: Self-pay | Admitting: Adult Health

## 2020-02-18 ENCOUNTER — Encounter: Payer: Self-pay | Admitting: Adult Health

## 2020-02-19 NOTE — Telephone Encounter (Signed)
I called patient to enquire about arm pain. She states the pain is in her R arm and it comes and goes. She states she is not able to lift objects with her R arm. No SOB, No chest pain, No dizziness or weakness with any other part of R body.   I advised patient that the provider is not in the office today or tomorrow and that I suggest she go to [facility] or ED for evaluation. Patient was reluctant to go and states she just wants to schedule next available in our office but I advised her that she needed to be evaluated before next available apt and needed to seek care at [facility] or ED. Patient verbalized understanding. AS, CMA

## 2020-02-20 DIAGNOSIS — S52572D Other intraarticular fracture of lower end of left radius, subsequent encounter for closed fracture with routine healing: Secondary | ICD-10-CM | POA: Diagnosis not present

## 2020-02-20 DIAGNOSIS — M25532 Pain in left wrist: Secondary | ICD-10-CM | POA: Diagnosis not present

## 2020-02-20 DIAGNOSIS — G5632 Lesion of radial nerve, left upper limb: Secondary | ICD-10-CM | POA: Diagnosis not present

## 2020-02-20 DIAGNOSIS — S52692D Other fracture of lower end of left ulna, subsequent encounter for closed fracture with routine healing: Secondary | ICD-10-CM | POA: Diagnosis not present

## 2020-02-20 DIAGNOSIS — M25632 Stiffness of left wrist, not elsewhere classified: Secondary | ICD-10-CM | POA: Diagnosis not present

## 2020-03-11 ENCOUNTER — Other Ambulatory Visit: Payer: Self-pay | Admitting: Adult Health

## 2020-03-15 DIAGNOSIS — H5213 Myopia, bilateral: Secondary | ICD-10-CM | POA: Diagnosis not present

## 2020-03-15 DIAGNOSIS — H52209 Unspecified astigmatism, unspecified eye: Secondary | ICD-10-CM | POA: Diagnosis not present

## 2020-04-15 ENCOUNTER — Other Ambulatory Visit: Payer: Self-pay | Admitting: Adult Health

## 2020-04-15 DIAGNOSIS — Z1231 Encounter for screening mammogram for malignant neoplasm of breast: Secondary | ICD-10-CM

## 2020-05-16 DIAGNOSIS — M810 Age-related osteoporosis without current pathological fracture: Secondary | ICD-10-CM | POA: Diagnosis not present

## 2020-05-16 DIAGNOSIS — Z0000 Encounter for general adult medical examination without abnormal findings: Secondary | ICD-10-CM | POA: Diagnosis not present

## 2020-05-16 DIAGNOSIS — Z1389 Encounter for screening for other disorder: Secondary | ICD-10-CM | POA: Diagnosis not present

## 2020-05-16 DIAGNOSIS — K900 Celiac disease: Secondary | ICD-10-CM | POA: Diagnosis not present

## 2020-05-16 DIAGNOSIS — E663 Overweight: Secondary | ICD-10-CM | POA: Diagnosis not present

## 2020-05-16 DIAGNOSIS — Z136 Encounter for screening for cardiovascular disorders: Secondary | ICD-10-CM | POA: Diagnosis not present

## 2020-05-21 ENCOUNTER — Other Ambulatory Visit: Payer: Self-pay | Admitting: Physician Assistant

## 2020-05-21 DIAGNOSIS — M810 Age-related osteoporosis without current pathological fracture: Secondary | ICD-10-CM

## 2020-07-02 DIAGNOSIS — H401123 Primary open-angle glaucoma, left eye, severe stage: Secondary | ICD-10-CM | POA: Diagnosis not present

## 2020-07-04 DIAGNOSIS — Z1231 Encounter for screening mammogram for malignant neoplasm of breast: Secondary | ICD-10-CM | POA: Diagnosis not present

## 2020-07-10 DIAGNOSIS — N390 Urinary tract infection, site not specified: Secondary | ICD-10-CM | POA: Diagnosis not present

## 2020-07-10 DIAGNOSIS — R350 Frequency of micturition: Secondary | ICD-10-CM | POA: Diagnosis not present

## 2020-07-30 DIAGNOSIS — H5213 Myopia, bilateral: Secondary | ICD-10-CM | POA: Diagnosis not present

## 2020-07-30 DIAGNOSIS — Z0100 Encounter for examination of eyes and vision without abnormal findings: Secondary | ICD-10-CM | POA: Diagnosis not present

## 2020-07-30 DIAGNOSIS — H52209 Unspecified astigmatism, unspecified eye: Secondary | ICD-10-CM | POA: Diagnosis not present

## 2020-08-06 DIAGNOSIS — R928 Other abnormal and inconclusive findings on diagnostic imaging of breast: Secondary | ICD-10-CM | POA: Diagnosis not present

## 2020-08-14 ENCOUNTER — Inpatient Hospital Stay: Admission: RE | Admit: 2020-08-14 | Payer: Medicare HMO | Source: Ambulatory Visit

## 2020-08-14 ENCOUNTER — Other Ambulatory Visit: Payer: Medicare HMO

## 2020-09-03 DIAGNOSIS — N390 Urinary tract infection, site not specified: Secondary | ICD-10-CM | POA: Diagnosis not present

## 2020-10-02 DIAGNOSIS — H401123 Primary open-angle glaucoma, left eye, severe stage: Secondary | ICD-10-CM | POA: Diagnosis not present

## 2020-10-24 DIAGNOSIS — F419 Anxiety disorder, unspecified: Secondary | ICD-10-CM | POA: Diagnosis not present

## 2020-10-29 DIAGNOSIS — R69 Illness, unspecified: Secondary | ICD-10-CM | POA: Diagnosis not present

## 2021-01-28 DIAGNOSIS — H401111 Primary open-angle glaucoma, right eye, mild stage: Secondary | ICD-10-CM | POA: Diagnosis not present

## 2021-02-05 IMAGING — DX DG WRIST COMPLETE 3+V*L*
4 series · 4 of 4 positions shown · non-contrast
Comparison: 09/08/2019

CLINICAL DATA: Fall, left wrist pain

EXAM:
LEFT WRIST - COMPLETE 3+ VIEW

[wrist pa (1 of 3)]
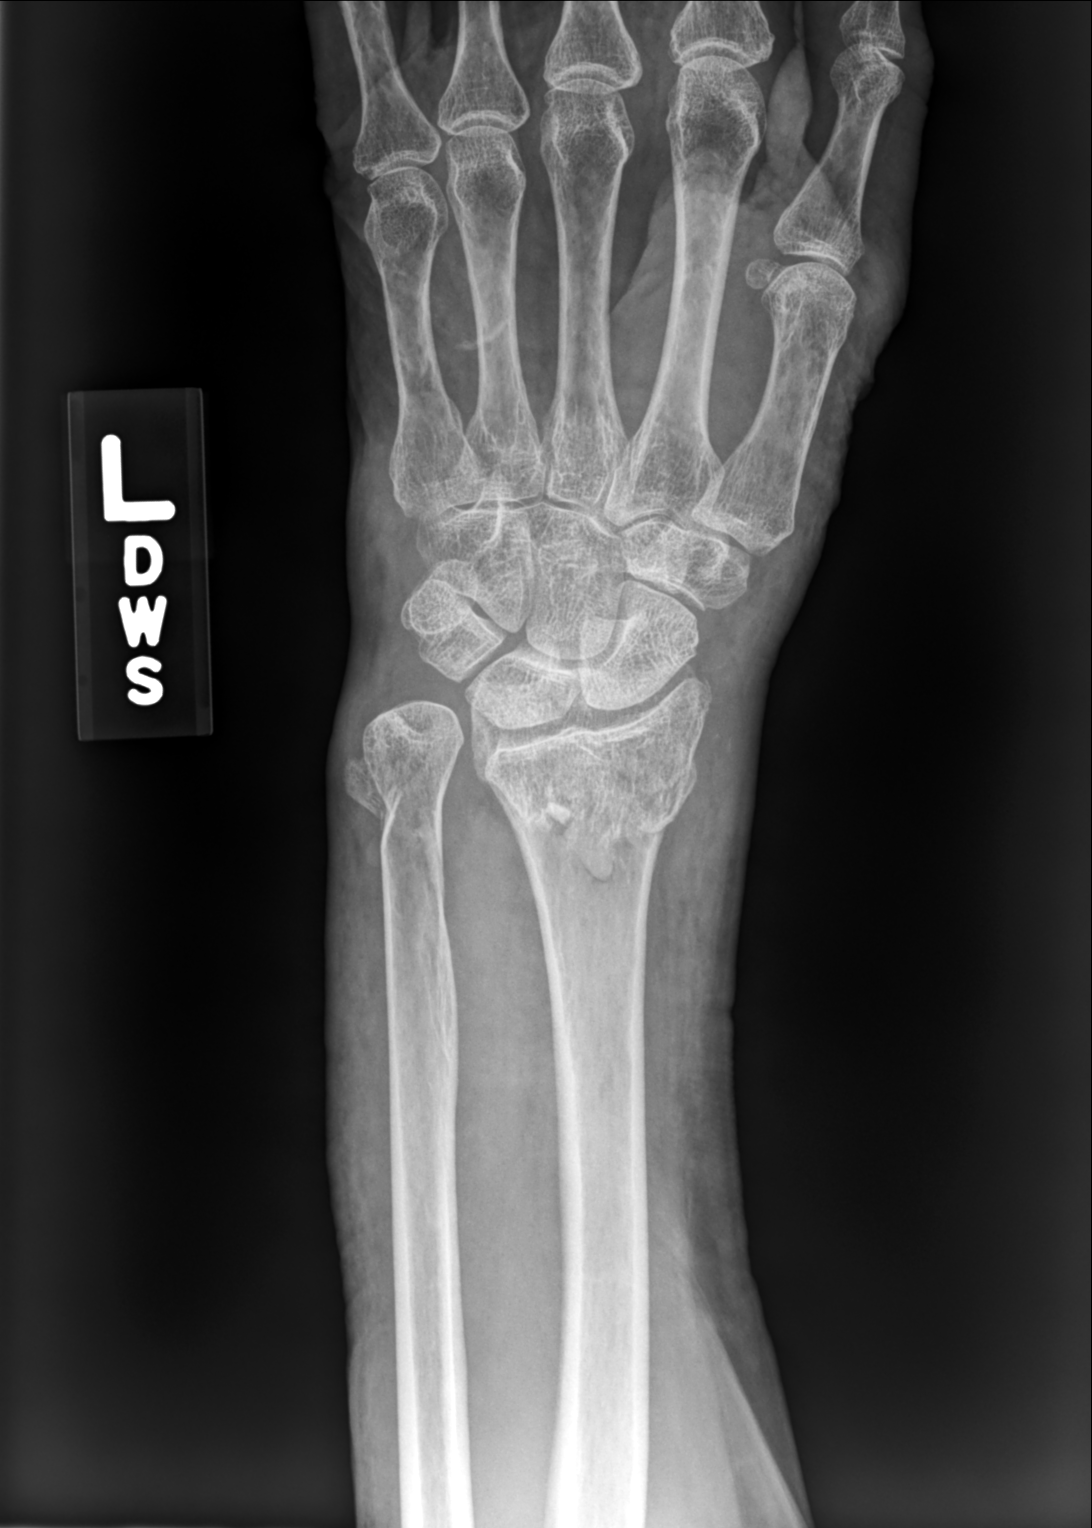

[wrist pa (2 of 3)]
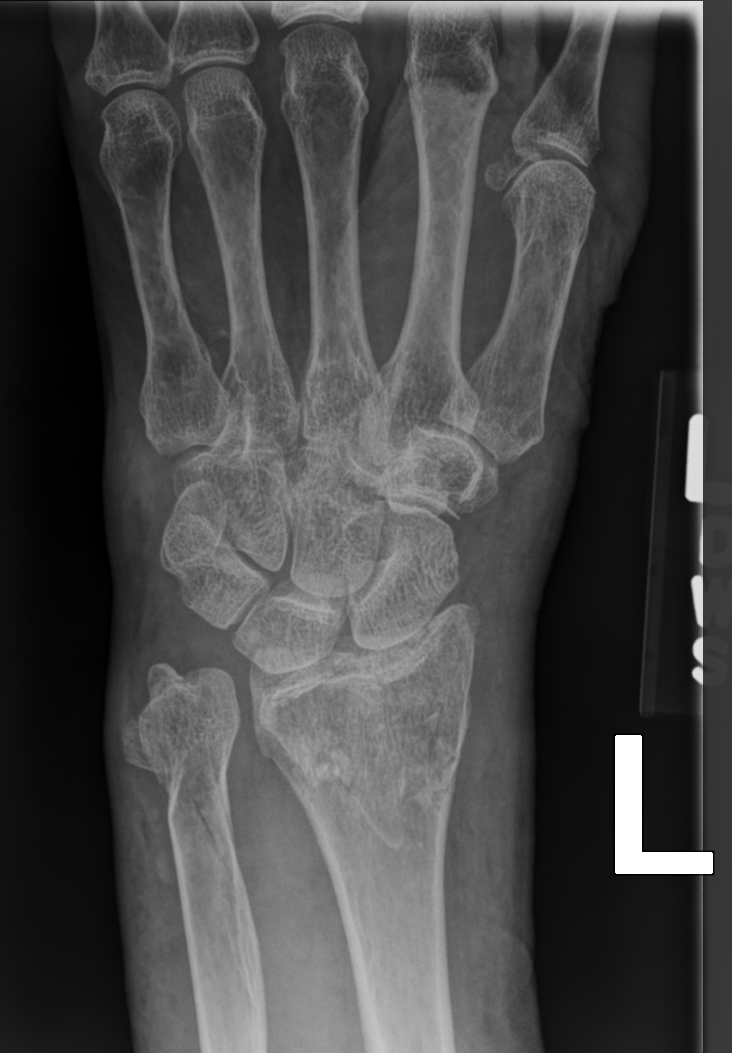

[wrist pa (3 of 3)]
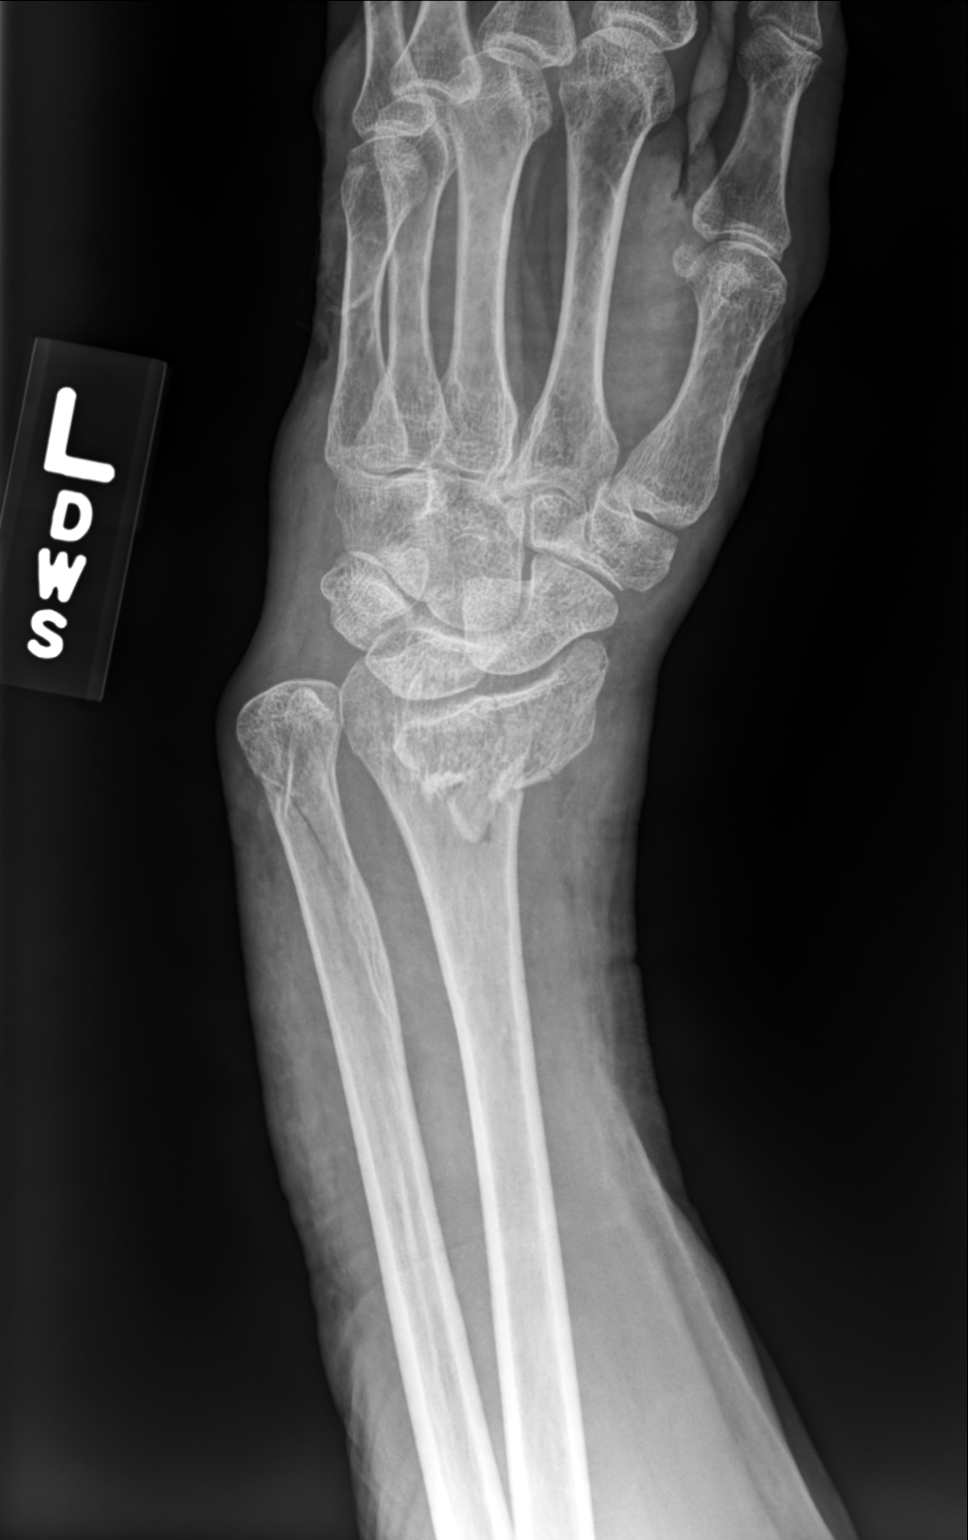

[wrist lat]
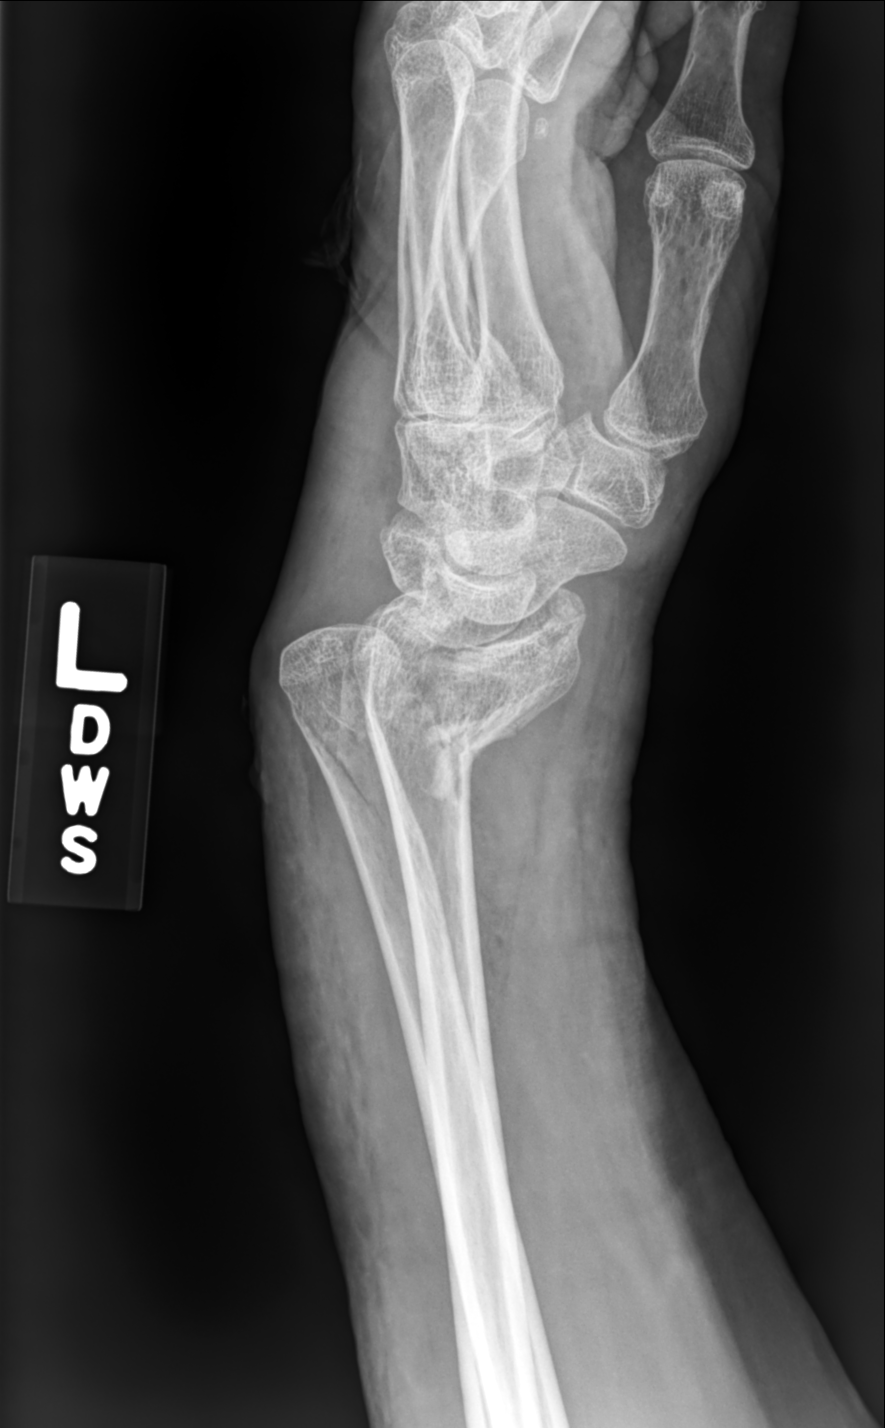

[4 of 4 positions shown; findings below may reference images not displayed]

FINDINGS: There is a comminuted, displaced intra-articular fracture in the
distal left radius. Distal radial fragments are displaced
anteriorly. Oblique fracture noted through the distal left ulna.
IMPRESSION: Distal left radial and ulnar fractures.

## 2021-05-19 DIAGNOSIS — Z0000 Encounter for general adult medical examination without abnormal findings: Secondary | ICD-10-CM | POA: Diagnosis not present

## 2021-05-19 DIAGNOSIS — E663 Overweight: Secondary | ICD-10-CM | POA: Diagnosis not present

## 2021-05-19 DIAGNOSIS — E785 Hyperlipidemia, unspecified: Secondary | ICD-10-CM | POA: Diagnosis not present

## 2021-05-19 DIAGNOSIS — K900 Celiac disease: Secondary | ICD-10-CM | POA: Diagnosis not present

## 2021-05-19 DIAGNOSIS — M810 Age-related osteoporosis without current pathological fracture: Secondary | ICD-10-CM | POA: Diagnosis not present

## 2021-05-19 DIAGNOSIS — G1221 Amyotrophic lateral sclerosis: Secondary | ICD-10-CM | POA: Diagnosis not present

## 2021-05-19 DIAGNOSIS — Z23 Encounter for immunization: Secondary | ICD-10-CM | POA: Diagnosis not present

## 2021-05-19 DIAGNOSIS — N8110 Cystocele, unspecified: Secondary | ICD-10-CM | POA: Diagnosis not present

## 2021-05-28 DIAGNOSIS — M8589 Other specified disorders of bone density and structure, multiple sites: Secondary | ICD-10-CM | POA: Diagnosis not present

## 2021-05-28 DIAGNOSIS — Z780 Asymptomatic menopausal state: Secondary | ICD-10-CM | POA: Diagnosis not present

## 2021-06-16 DIAGNOSIS — H401111 Primary open-angle glaucoma, right eye, mild stage: Secondary | ICD-10-CM | POA: Diagnosis not present

## 2021-06-19 DIAGNOSIS — N941 Unspecified dyspareunia: Secondary | ICD-10-CM | POA: Diagnosis not present

## 2021-06-19 DIAGNOSIS — N393 Stress incontinence (female) (male): Secondary | ICD-10-CM | POA: Diagnosis not present

## 2021-06-20 DIAGNOSIS — H52209 Unspecified astigmatism, unspecified eye: Secondary | ICD-10-CM | POA: Diagnosis not present

## 2021-06-20 DIAGNOSIS — H5213 Myopia, bilateral: Secondary | ICD-10-CM | POA: Diagnosis not present

## 2021-06-20 DIAGNOSIS — H524 Presbyopia: Secondary | ICD-10-CM | POA: Diagnosis not present

## 2021-10-16 DIAGNOSIS — H401111 Primary open-angle glaucoma, right eye, mild stage: Secondary | ICD-10-CM | POA: Diagnosis not present

## 2021-11-25 DIAGNOSIS — H401111 Primary open-angle glaucoma, right eye, mild stage: Secondary | ICD-10-CM | POA: Diagnosis not present

## 2021-12-23 DIAGNOSIS — R5383 Other fatigue: Secondary | ICD-10-CM | POA: Diagnosis not present

## 2021-12-23 DIAGNOSIS — R051 Acute cough: Secondary | ICD-10-CM | POA: Diagnosis not present

## 2021-12-23 DIAGNOSIS — B349 Viral infection, unspecified: Secondary | ICD-10-CM | POA: Diagnosis not present

## 2021-12-23 DIAGNOSIS — Z20822 Contact with and (suspected) exposure to covid-19: Secondary | ICD-10-CM | POA: Diagnosis not present

## 2021-12-29 DIAGNOSIS — H52209 Unspecified astigmatism, unspecified eye: Secondary | ICD-10-CM | POA: Diagnosis not present

## 2021-12-29 DIAGNOSIS — H524 Presbyopia: Secondary | ICD-10-CM | POA: Diagnosis not present

## 2021-12-29 DIAGNOSIS — H5213 Myopia, bilateral: Secondary | ICD-10-CM | POA: Diagnosis not present

## 2022-03-09 DIAGNOSIS — H401111 Primary open-angle glaucoma, right eye, mild stage: Secondary | ICD-10-CM | POA: Diagnosis not present

## 2022-05-05 ENCOUNTER — Emergency Department (HOSPITAL_COMMUNITY)
Admission: EM | Admit: 2022-05-05 | Discharge: 2022-05-05 | Disposition: A | Discharge status: Home / Self Care | Payer: Medicare HMO | Attending: Emergency Medicine | Admitting: Emergency Medicine

## 2022-05-05 ENCOUNTER — Emergency Department (HOSPITAL_COMMUNITY): Payer: Medicare HMO

## 2022-05-05 ENCOUNTER — Encounter (HOSPITAL_COMMUNITY): Payer: Self-pay

## 2022-05-05 ENCOUNTER — Other Ambulatory Visit: Payer: Self-pay

## 2022-05-05 DIAGNOSIS — M7989 Other specified soft tissue disorders: Secondary | ICD-10-CM | POA: Diagnosis not present

## 2022-05-05 DIAGNOSIS — W19XXXA Unspecified fall, initial encounter: Secondary | ICD-10-CM | POA: Diagnosis not present

## 2022-05-05 DIAGNOSIS — S52501A Unspecified fracture of the lower end of right radius, initial encounter for closed fracture: Secondary | ICD-10-CM | POA: Diagnosis not present

## 2022-05-05 DIAGNOSIS — M25531 Pain in right wrist: Secondary | ICD-10-CM | POA: Diagnosis not present

## 2022-05-05 DIAGNOSIS — Y93K1 Activity, walking an animal: Secondary | ICD-10-CM | POA: Diagnosis not present

## 2022-05-05 MED ORDER — HYDROCODONE-ACETAMINOPHEN 5-325 MG PO TABS: 1.0000 | ORAL_TABLET | Freq: Four times a day (QID) | ORAL | 0 refills | Status: DC | PRN

## 2022-05-05 NOTE — Discharge Instructions (Addendum)
Your x-ray shows that you have a distal radius fracture in your right wrist.  We have placed you in a splint at the recommendation of the orthopedic specialist.  Their office will call you regarding follow-up. ?Take the pain medicine as needed to help with your pain. ?Return to the ER if you start to experience worsening pain, increased swelling, additional injuries, numbness or weakness. ?

## 2022-05-05 NOTE — ED Provider Notes (Signed)
?[facility] DEPT ?Provider Note ? ? ?CSN: [id number] ?Arrival date & time: 05/05/22  0749 ? ?  ? ?History ? ?Chief Complaint  ?Patient presents with  ? Wrist Pain  ? ? ?Karen Villanueva is a 66 y.o. female presenting to the ED with a chief complaint of right wrist pain.  Reports lateral right wrist pain after mechanical fall just prior to arrival.  She was walking her dog when the dog saw another animal and ran towards it.  States that she did not sustain any head injury, no loss of consciousness, dizziness, blurry vision or headache.  She believes that she fell onto her right outstretched hand.  She has been having pain and swelling in the right lateral wrist since then.  Denies any anticoagulant use.  No prior fracture, dislocation or procedure in the area.  A few years ago she did have a left sided distal radius and ulnar fracture from a similar mechanism.  Denies any neck pain, back pain, numbness, weakness.  No pain prior to this incident ? ? ?Wrist Pain ? ? ?  ? ?Home Medications ?Prior to Admission medications   ?Medication Sig Start Date End Date Taking? Authorizing Provider  ?HYDROcodone-acetaminophen (NORCO/VICODIN) 5-325 MG tablet Take 1 tablet by mouth every 6 (six) hours as needed. 05/05/22  Yes Korissa Horsford, PA-C  ?alendronate (FOSAMAX) 70 MG tablet Take 1 tablet (70 mg total) by mouth every 7 (seven) days. Take with a full glass of water on an empty stomach. 03/21/19   Danford, Valetta Fuller D, NP  ?doxycycline (VIBRAMYCIN) 100 MG capsule Take 1 capsule (100 mg total) by mouth 2 (two) times daily. 10/19/19   Hedges, Dellis Filbert, PA-C  ?ibuprofen (ADVIL) 400 MG tablet Take 400 mg by mouth every 6 (six) hours as needed.    [provider]  ?Melatonin 5 MG TABS Take 5 mg by mouth at bedtime.    [provider]  ?   ? ?Allergies    ?Wheat bran   ? ?Review of Systems   ?Review of Systems  ?Constitutional:  Negative for chills and fever.  ?Musculoskeletal:  Positive for  arthralgias and joint swelling.  ?Skin:  Negative for wound.  ?Neurological:  Negative for weakness and numbness.  ? ?Physical Exam ?Updated Vital Signs ?BP (!) 162/92 (BP Location: Right Arm)   Pulse 70   Temp 98.1 ?F (36.7 ?C) (Oral)   Resp 16   SpO2 98%  ?Physical Exam ?Vitals and nursing note reviewed.  ?Constitutional:   ?   General: She is not in acute distress. ?   Appearance: She is well-developed. She is not diaphoretic.  ?HENT:  ?   Head: Normocephalic and atraumatic.  ?Eyes:  ?   General: No scleral icterus. ?   Conjunctiva/sclera: Conjunctivae normal.  ?Pulmonary:  ?   Effort: Pulmonary effort is normal. No respiratory distress.  ?Musculoskeletal:     ?   General: Swelling and tenderness present.  ?   Cervical back: Normal range of motion.  ?   Comments: Edema and tenderness noted of the right wrist near the distal radius.  2+ radial pulse noted bilaterally.  Normal capillary refill.  Limited range of motion secondary to pain.  No deformities.  No erythema or warmth of joint.  Normal sensation to light touch of bilateral upper extremities.  Normal range of motion of bilateral elbows, shoulder.  Equal grip strength bilaterally.  ?Skin: ?   Findings: No rash.  ?Neurological:  ?  Mental Status: She is alert and oriented to person, place, and time.  ? ? ?ED Results / Procedures / Treatments   ?Labs ?(all labs ordered are listed, but only abnormal results are displayed) ?Labs Reviewed - No data to display ? ?EKG ?None ? ?Radiology ?DG Wrist Complete Right ? ?Result Date: 05/05/2022 ?CLINICAL DATA:  Fall, wrist pain EXAM: RIGHT WRIST - COMPLETE 3+ VIEW COMPARISON:  Right wrist radiographs 09/08/2019 FINDINGS: There is an impacted minimally dorsally angulated fracture of the distal radius with suspected intra-articular extension. Radiocarpal alignment is maintained. There is surrounding soft tissue swelling. No other fracture is seen. IMPRESSION: Impacted minimally dorsally angulated fracture of the distal  radius with suspected intra-articular extension. Electronically Signed   By: Valetta Mole M.D.   On: 05/05/2022 08:22   ? ?Procedures ?Procedures  ? ? ?Medications Ordered in ED ?Medications - No data to display ? ?ED Course/ Medical Decision Making/ A&P ?  ?                        ?Medical Decision Making ?Amount and/or Complexity of Data Reviewed ?Radiology: ordered. ? ? ?66 year old female presenting to the ED for right lateral wrist pain after injury while walking her dog.  This was a mechanical fall.  Denies any head injury, loss of consciousness, anticoagulant use, headache or blurry vision.  On exam there is swelling and tenderness to her right lateral wrist near the distal radius.  Equal and intact distal pulses of bilateral upper extremities.  No deformities.  Compartments are soft.  Normal sensation to light touch of bilateral lower extremities.  Patient states that she is certain that she did not sustain a head injury did offer imaging based on her age.  No gross neurological deficits noted.  X-ray shows a distal radius fracture that is minimally impacted and dorsally angulated.  I spoke to Hilbert Odor, on-call orthopedic PA who recommends sugar-tong splint and orthopedist office will contact the patient to schedule a follow-up appointment.  Patient is agreeable to the plan.  Return precautions given. ? ?All imaging, if done today, including plain films, CT scans, and ultrasounds, independently reviewed by me, and interpretations confirmed via formal radiology reads. ? ?Patient is hemodynamically stable, in NAD, and able to ambulate in the ED. Evaluation does not show pathology that would require ongoing emergent intervention or inpatient treatment. I explained the diagnosis to the patient. Pain has been managed and has no complaints prior to discharge. Patient is comfortable with above plan and is stable for discharge at this time. All questions were answered prior to disposition. Strict return  precautions for returning to the ED were discussed. Encouraged follow up with PCP.  ? ?An After Visit Summary was printed and given to the patient. ? ? ?Portions of this note were generated with [employer]. Dictation errors may occur despite best attempts at proofreading. ? ? ? ? ? ? ? ? ? ?Final Clinical Impression(s) / ED Diagnoses ?Final diagnoses:  ?Closed fracture of distal end of right radius, unspecified fracture morphology, initial encounter  ? ? ?Rx / DC Orders ?ED Discharge Orders   ? ?      Ordered  ?  HYDROcodone-acetaminophen (NORCO/VICODIN) 5-325 MG tablet  Every 6 hours PRN       ? 05/05/22 0910  ? ?  ?  ? ?  ? ? ?  Delia Heady, PA-C ?05/05/22 7654 ? ?  ?Regan Lemming, MD ?05/05/22 [phone] ? ?

## 2022-05-05 NOTE — Consult Note (Signed)
?ORTHOPAEDIC CONSULTATION HISTORY & PHYSICAL ?REQUESTING PHYSICIAN: Regan Lemming, MD ? ?Chief Complaint: Right wrist injury ?HPI: ?Karen Villanueva is a 66 y.o. female who fell onto an outstretched hand injuring her right wrist.  She presented to the emergency department for further evaluation and management.  She denies significant pain other than at the region of the distal radius.  She takes no anticoagulants or reports any other medical history impacting surgical planning ? ?Past Medical History:  ?Diagnosis Date  ? ALS (amyotrophic lateral sclerosis) ([place])   ? Celiac disease   ? Closed fracture of left distal radius   ? Left ulnar fracture   ? left  ? Osteoporosis   ? ?Past Surgical History:  ?Procedure Laterality Date  ? ABDOMINAL HYSTERECTOMY  1994  ? FEMUR FRACTURE SURGERY Right   ? OPEN REDUCTION INTERNAL FIXATION (ORIF) DISTAL RADIAL FRACTURE Left 10/31/2019  ? Procedure: OPEN REDUCTION INTERNAL FIXATION (ORIF) LEFT DISTAL RADIAL FRACTURE,  CLOSED REDUCTION LEFT ULNA;  Surgeon: Leanora Cover, MD;  Location: [facility];  Service: Orthopedics;  Laterality: Left;  ? ?Social History  ? ?Socioeconomic History  ? Marital status: Married  ?  Spouse name: Not on file  ? Number of children: Not on file  ? Years of education: Not on file  ? Highest education level: Not on file  ?Occupational History  ? Not on file  ?Tobacco Use  ? Smoking status: Former  ? Smokeless tobacco: Never  ?Vaping Use  ? Vaping Use: Never used  ?Substance and Sexual Activity  ? Alcohol use: Not Currently  ? Drug use: Never  ? Sexual activity: Not on file  ?Other Topics Concern  ? Not on file  ?Social History Narrative  ? Not on file  ? ?Social Determinants of Health  ? ?Financial Resource Strain: Not on file  ?Food Insecurity: Not on file  ?Transportation Needs: Not on file  ?Physical Activity: Not on file  ?Stress: Not on file  ?Social Connections: Not on file  ? ?Family History  ?Problem Relation Age of Onset  ? Heart failure  Mother   ? Diabetes Father   ? ?Allergies  ?Allergen Reactions  ? Wheat Bran Diarrhea  ?  Other reaction(s): Other (See Comments) ?Celiac Disease, NO GLUTEN ?  ? ?Prior to Admission medications   ?Medication Sig Start Date End Date Taking? Authorizing Provider  ?alendronate (FOSAMAX) 70 MG tablet Take 1 tablet (70 mg total) by mouth every 7 (seven) days. Take with a full glass of water on an empty stomach. 03/21/19   Danford, Valetta Fuller D, NP  ?doxycycline (VIBRAMYCIN) 100 MG capsule Take 1 capsule (100 mg total) by mouth 2 (two) times daily. 10/19/19   Hedges, Dellis Filbert, PA-C  ?HYDROcodone-acetaminophen (NORCO) 5-325 MG tablet 1-2 tabs po q6 hours prn pain 10/31/19   Leanora Cover, MD  ?HYDROcodone-acetaminophen (NORCO/VICODIN) 5-325 MG tablet Take 1 tablet by mouth every 6 (six) hours as needed. 10/19/19   Hedges, Dellis Filbert, PA-C  ?ibuprofen (ADVIL) 400 MG tablet Take 400 mg by mouth every 6 (six) hours as needed.    [provider]  ?Melatonin 5 MG TABS Take 5 mg by mouth at bedtime.    [provider]  ? ?DG Wrist Complete Right ? ?Result Date: 05/05/2022 ?CLINICAL DATA:  Fall, wrist pain EXAM: RIGHT WRIST - COMPLETE 3+ VIEW COMPARISON:  Right wrist radiographs 09/08/2019 FINDINGS: There is an impacted minimally dorsally angulated fracture of the distal radius with suspected intra-articular extension. Radiocarpal alignment is maintained.  There is surrounding soft tissue swelling. No other fracture is seen. IMPRESSION: Impacted minimally dorsally angulated fracture of the distal radius with suspected intra-articular extension. Electronically Signed   By: Valetta Mole M.D.   On: 05/05/2022 08:22   ? ?Positive ROS: All other systems have been reviewed and were otherwise negative with the exception of those mentioned in the HPI and as above. ? ?Physical Exam: ?Vitals: Refer to EMR. ?Constitutional:  WD, WN, NAD ?HEENT:  NCAT, EOMI ?Neuro/Psych:  Alert & oriented to person, place, and time; appropriate mood &  affect ?Lymphatic: No generalized extremity edema or lymphadenopathy ?Extremities / MSK:  The extremities are normal with respect to appearance, ranges of motion, joint stability, muscle strength/tone, sensation, & perfusion except as otherwise noted: ? ?The right wrist is tender at the distal radial metaphyseal region, but not about the elbow or otherwise.  There is slight dorsal angulatory deformity noted.  Intact light touch sensibility in the radial, median, and ulnar nerve distributions with intact motor to the same.  Hand is warm and well perfused ? ?Assessment: ?Distal radial metaphyseal fracture on the right, with 20? dorsal tilt ? ?Plan: ?I discussed these findings with her.  I reviewed the indications for surgery.  She will be placedinto a sugar tong splint and encouraged in digital motion.  My office will contact her tomorrow to arrange an appointment for Thursday, at which time we will obtain new x-rays of the right wrist in the splint and help determine if there is been any change in alignment of her fracture.  We will further discuss the pros and cons of operative versus nonoperative management.  Questions were invited and answered. ? ?Rayvon Char. Grandville Silos, MD      ?Orthopaedic & Hand Surgery ?[facility] ?[address] ?[place], [place]  [address] ?Office: [phone] ?Mobile: [phone] ? ?05/05/2022, 8:50 AM  ? ?

## 2022-05-05 NOTE — ED Triage Notes (Signed)
Pt reports she was walking her dog and fell on her right wrist. Swelling and pain to right wrist. Denies any other injuries. Denies hitting her head.  ?

## 2022-05-05 NOTE — Progress Notes (Signed)
Orthopedic Tech Progress Note ?Patient Details:  ?Karen Villanueva ?06/04/1956 ?[id number] ? ?Patient ID: Karen Villanueva, female   DOB: Mar 11, 1956, 66 y.o.   MRN: [id number] ? ?Karen Villanueva ?05/05/2022, 8:39 AM ?Two rings removed and given to patient. ?

## 2022-05-07 DIAGNOSIS — S52501A Unspecified fracture of the lower end of right radius, initial encounter for closed fracture: Secondary | ICD-10-CM | POA: Diagnosis not present

## 2022-05-14 DIAGNOSIS — S52551A Other extraarticular fracture of lower end of right radius, initial encounter for closed fracture: Secondary | ICD-10-CM | POA: Diagnosis not present

## 2022-05-20 DIAGNOSIS — G8918 Other acute postprocedural pain: Secondary | ICD-10-CM | POA: Diagnosis not present

## 2022-05-20 DIAGNOSIS — S52551A Other extraarticular fracture of lower end of right radius, initial encounter for closed fracture: Secondary | ICD-10-CM | POA: Diagnosis not present

## 2022-05-26 DIAGNOSIS — Z23 Encounter for immunization: Secondary | ICD-10-CM | POA: Diagnosis not present

## 2022-05-26 DIAGNOSIS — R011 Cardiac murmur, unspecified: Secondary | ICD-10-CM | POA: Diagnosis not present

## 2022-05-26 DIAGNOSIS — Z0000 Encounter for general adult medical examination without abnormal findings: Secondary | ICD-10-CM | POA: Diagnosis not present

## 2022-05-26 DIAGNOSIS — E785 Hyperlipidemia, unspecified: Secondary | ICD-10-CM | POA: Diagnosis not present

## 2022-05-27 ENCOUNTER — Telehealth: Payer: Self-pay

## 2022-05-27 NOTE — Telephone Encounter (Signed)
NOTES SCANNED TO REFERRAL 

## 2022-06-04 DIAGNOSIS — S52501P Unspecified fracture of the lower end of right radius, subsequent encounter for closed fracture with malunion: Secondary | ICD-10-CM | POA: Diagnosis not present

## 2022-06-07 NOTE — Progress Notes (Unsigned)
Cardiology Office Note:   Date:  06/08/2022  NAME:  Karen Villanueva    MRN: [id number] DOB:  1956-03-18   PCP:  Almedia Balls, NP  Cardiologist:  None  Electrophysiologist:  None   Referring MD: No ref. provider found   Chief Complaint  Patient presents with   Heart Murmur    History of Present Illness:   Karen Villanueva is a 66 y.o. female with a hx of ALS who is being seen today for the evaluation of murmur at the request of Almedia Balls, NP.  She recently underwent wrist surgery.  Anesthesiologist told her she had a murmur.  Never had a history of this.  EKG is normal.  She has a history of ALS but apparently this is mild.  She is fully independent.  Works part-time as a Scientist, water quality at the [employer]  Denies any chest pain.  Is getting short of breath when she lays down at night.  She reports she not been that active so is unclear if this will worsen with activity.  Murmur is very prominent.  3 out of 6 holosystolic murmur.  Does not radiate into the carotids.  Overall suspect this is mitral valve regurgitation.  We discussed getting an echocardiogram to determine the severity.  She also needs a BNP.  We will check a TSH given her symptoms of fatigue.  Also need a kidney profile.  She denies any other symptoms in office today.  She is a never smoker.  She does not drink alcohol in excess.  No drug use.  She is married.  No children.  She reports some stress in her life but overall is within good spirits.  Her shortness of breath is concerning especially with a new murmur.  Past Medical History: Past Medical History:  Diagnosis Date   ALS (amyotrophic lateral sclerosis) ([place])    Celiac disease    Closed fracture of left distal radius    Left ulnar fracture    left   Osteoporosis     Past Surgical History: Past Surgical History:  Procedure Laterality Date   ABDOMINAL HYSTERECTOMY  1994   FEMUR FRACTURE SURGERY Right    OPEN REDUCTION INTERNAL FIXATION (ORIF) DISTAL RADIAL FRACTURE  Left 10/31/2019   Procedure: OPEN REDUCTION INTERNAL FIXATION (ORIF) LEFT DISTAL RADIAL FRACTURE,  CLOSED REDUCTION LEFT ULNA;  Surgeon: Leanora Cover, MD;  Location: [facility];  Service: Orthopedics;  Laterality: Left;    Current Medications: Current Meds  Medication Sig   ibuprofen (ADVIL) 400 MG tablet Take 400 mg by mouth every 6 (six) hours as needed.   Melatonin 5 MG TABS Take 5 mg by mouth at bedtime.     Allergies:    Wheat bran   Social History: Social History   Socioeconomic History   Marital status: Married    Spouse name: Not on file   Number of children: 0   Years of education: Not on file   Highest education level: Not on file  Occupational History   Occupation: Part time Scientist, water quality - [employer]  Tobacco Use   Smoking status: Former   Smokeless tobacco: Never  Scientific laboratory technician Use: Never used  Substance and Sexual Activity   Alcohol use: Not Currently   Drug use: Never   Sexual activity: Not on file  Other Topics Concern   Not on file  Social History Narrative   Not on file   Social Determinants of Radio broadcast assistant  Strain: Not on file  Food Insecurity: Not on file  Transportation Needs: Not on file  Physical Activity: Not on file  Stress: Not on file  Social Connections: Not on file     Family History: The patient's family history includes Diabetes in her father; Heart failure in her mother.  ROS:   All other ROS reviewed and negative. Pertinent positives noted in the HPI.     EKGs/Labs/Other Studies Reviewed:   The following studies were personally reviewed by me today:  EKG:  EKG is ordered today.  The ekg ordered today demonstrates normal sinus rhythm heart rate 75, no acute ischemic changes or evidence of infarction, and was personally reviewed by me.   Recent Labs: No results found for requested labs within last 365 days.   Recent Lipid Panel    Component Value Date/Time   CHOL 202 (H) 06/13/2019 0904   TRIG 75  06/13/2019 0904   HDL 46 06/13/2019 0904   CHOLHDL 4.4 06/13/2019 0904   LDLCALC 141 (H) 06/13/2019 0904    Physical Exam:   VS:  BP 136/84   Pulse 75   Ht 5' 8"  (1.727 m)   Wt 150 lb 12.8 oz (68.4 kg)   SpO2 99%   BMI 22.93 kg/m    Wt Readings from Last 3 Encounters:  06/08/22 150 lb 12.8 oz (68.4 kg)  10/31/19 166 lb 0.1 oz (75.3 kg)  10/19/19 165 lb (74.8 kg)    General: Well nourished, well developed, in no acute distress Head: Atraumatic, normal size  Eyes: PEERLA, EOMI  Neck: Supple, no JVD Endocrine: No thryomegaly Cardiac: Normal S1, S2; RRR; no murmurs, rubs, or gallops Lungs: Clear to auscultation bilaterally, no wheezing, rhonchi or rales  Abd: Soft, nontender, no hepatomegaly  Ext: No edema, pulses 2+ Musculoskeletal: No deformities, BUE and BLE strength normal and equal Skin: Warm and dry, no rashes   Neuro: Alert and oriented to person, place, time, and situation, CNII-XII grossly intact, no focal deficits  Psych: Normal mood and affect   ASSESSMENT:   Karen Villanueva is a 66 y.o. female who presents for the following: 1. Murmur   2. SOB (shortness of breath)    PLAN:   1. Murmur 2. SOB (shortness of breath) -Prominent 3 out of 6 holosystolic murmur.  Does not radiate into the carotids.  Suspect this is the murmur of mitral valve regurgitation.  I can hear in her axilla.  It does radiate anteriorly so I suspect this is a posterior flail leaflet.  She needs an echocardiogram which we will obtain in the next 1 to 2 weeks this will help gauge severity.  I would like to get a BNP as well as BMP and TSH.  She reports shortness of breath so may be developing early congestive heart failure.  She will see me back based on the results of her lab work as well as echocardiogram.  We will put her in for a 8-monthfollow-up but if I need to see her sooner I will.  Would recommend to proceed with early mitral valve repair if this is indeed a problem.  Disposition: Return in  about 6 months (around 12/08/2022).  Medication Adjustments/Labs and Tests Ordered: Current medicines are reviewed at length with the patient today.  Concerns regarding medicines are outlined above.  Orders Placed This Encounter  Procedures   Basic metabolic panel   Brain natriuretic peptide   TSH   EKG 12-Lead   ECHOCARDIOGRAM COMPLETE  No orders of the defined types were placed in this encounter.   Patient Instructions  Medication Instructions:  The current medical regimen is effective;  continue present plan and medications.  *If you need a refill on your cardiac medications before your next appointment, please call your pharmacy*   Lab Work: BMET, BNP, TSH today   If you have labs (blood work) drawn today and your tests are completely normal, you will receive your results only by: [facility] (if you have MyChart) OR A paper copy in the mail If you have any lab test that is abnormal or we need to change your treatment, we will call you to review the results.   Testing/Procedures: Echocardiogram (asap, in the next 2 weeks) - Your physician has requested that you have an echocardiogram. Echocardiography is a painless test that uses sound waves to create images of your heart. It provides your doctor with information about the size and shape of your heart and how well your heart's chambers and valves are working. This procedure takes approximately one hour. There are no restrictions for this procedure.     Follow-Up: At [facility], you and your health needs are our priority.  As part of our continuing mission to provide you with exceptional heart care, we have created designated Provider Care Teams.  These Care Teams include your primary Cardiologist (physician) and Advanced Practice Providers (APPs -  Physician Assistants and Nurse Practitioners) who all work together to provide you with the care you need, when you need it.  We recommend signing up for the patient  portal called "MyChart".  Sign up information is provided on this After Visit Summary.  MyChart is used to connect with patients for Virtual Visits (Telemedicine).  Patients are able to view lab/test results, encounter notes, upcoming appointments, etc.  Non-urgent messages can be sent to your provider as well.   To learn more about what you can do with MyChart, go to [email or web address].    Your next appointment:   6 month(s)  The format for your next appointment:   In Person  Provider:   Eleonore Chiquito, MD           Signed, Addison Naegeli. Audie Box, MD, [facility]  [address], [place],  [address] [phone]  06/08/2022 9:05 AM

## 2022-06-07 NOTE — H&P (View-Only) (Signed)
Cardiology Office Note:   Date:  06/08/2022  NAME:  Karen Villanueva    MRN: [id number] DOB:  Nov 25, 1956   PCP:  Almedia Balls, NP  Cardiologist:  None  Electrophysiologist:  None   Referring MD: No ref. provider found   Chief Complaint  Patient presents with   Heart Murmur    History of Present Illness:   Karen Villanueva is a 66 y.o. female with a hx of ALS who is being seen today for the evaluation of murmur at the request of Almedia Balls, NP.  She recently underwent wrist surgery.  Anesthesiologist told her she had a murmur.  Never had a history of this.  EKG is normal.  She has a history of ALS but apparently this is mild.  She is fully independent.  Works part-time as a Scientist, water quality at the [employer]  Denies any chest pain.  Is getting short of breath when she lays down at night.  She reports she not been that active so is unclear if this will worsen with activity.  Murmur is very prominent.  3 out of 6 holosystolic murmur.  Does not radiate into the carotids.  Overall suspect this is mitral valve regurgitation.  We discussed getting an echocardiogram to determine the severity.  She also needs a BNP.  We will check a TSH given her symptoms of fatigue.  Also need a kidney profile.  She denies any other symptoms in office today.  She is a never smoker.  She does not drink alcohol in excess.  No drug use.  She is married.  No children.  She reports some stress in her life but overall is within good spirits.  Her shortness of breath is concerning especially with a new murmur.  Past Medical History: Past Medical History:  Diagnosis Date   ALS (amyotrophic lateral sclerosis) ([place])    Celiac disease    Closed fracture of left distal radius    Left ulnar fracture    left   Osteoporosis     Past Surgical History: Past Surgical History:  Procedure Laterality Date   ABDOMINAL HYSTERECTOMY  1994   FEMUR FRACTURE SURGERY Right    OPEN REDUCTION INTERNAL FIXATION (ORIF) DISTAL RADIAL FRACTURE  Left 10/31/2019   Procedure: OPEN REDUCTION INTERNAL FIXATION (ORIF) LEFT DISTAL RADIAL FRACTURE,  CLOSED REDUCTION LEFT ULNA;  Surgeon: Leanora Cover, MD;  Location: [facility];  Service: Orthopedics;  Laterality: Left;    Current Medications: Current Meds  Medication Sig   ibuprofen (ADVIL) 400 MG tablet Take 400 mg by mouth every 6 (six) hours as needed.   Melatonin 5 MG TABS Take 5 mg by mouth at bedtime.     Allergies:    Wheat bran   Social History: Social History   Socioeconomic History   Marital status: Married    Spouse name: Not on file   Number of children: 0   Years of education: Not on file   Highest education level: Not on file  Occupational History   Occupation: Part time Scientist, water quality - [employer]  Tobacco Use   Smoking status: Former   Smokeless tobacco: Never  Scientific laboratory technician Use: Never used  Substance and Sexual Activity   Alcohol use: Not Currently   Drug use: Never   Sexual activity: Not on file  Other Topics Concern   Not on file  Social History Narrative   Not on file   Social Determinants of Radio broadcast assistant  Strain: Not on file  Food Insecurity: Not on file  Transportation Needs: Not on file  Physical Activity: Not on file  Stress: Not on file  Social Connections: Not on file     Family History: The patient's family history includes Diabetes in her father; Heart failure in her mother.  ROS:   All other ROS reviewed and negative. Pertinent positives noted in the HPI.     EKGs/Labs/Other Studies Reviewed:   The following studies were personally reviewed by me today:  EKG:  EKG is ordered today.  The ekg ordered today demonstrates normal sinus rhythm heart rate 75, no acute ischemic changes or evidence of infarction, and was personally reviewed by me.   Recent Labs: No results found for requested labs within last 365 days.   Recent Lipid Panel    Component Value Date/Time   CHOL 202 (H) 06/13/2019 0904   TRIG 75  06/13/2019 0904   HDL 46 06/13/2019 0904   CHOLHDL 4.4 06/13/2019 0904   LDLCALC 141 (H) 06/13/2019 0904    Physical Exam:   VS:  BP 136/84   Pulse 75   Ht 5' 8"  (1.727 m)   Wt 150 lb 12.8 oz (68.4 kg)   SpO2 99%   BMI 22.93 kg/m    Wt Readings from Last 3 Encounters:  06/08/22 150 lb 12.8 oz (68.4 kg)  10/31/19 166 lb 0.1 oz (75.3 kg)  10/19/19 165 lb (74.8 kg)    General: Well nourished, well developed, in no acute distress Head: Atraumatic, normal size  Eyes: PEERLA, EOMI  Neck: Supple, no JVD Endocrine: No thryomegaly Cardiac: Normal S1, S2; RRR; no murmurs, rubs, or gallops Lungs: Clear to auscultation bilaterally, no wheezing, rhonchi or rales  Abd: Soft, nontender, no hepatomegaly  Ext: No edema, pulses 2+ Musculoskeletal: No deformities, BUE and BLE strength normal and equal Skin: Warm and dry, no rashes   Neuro: Alert and oriented to person, place, time, and situation, CNII-XII grossly intact, no focal deficits  Psych: Normal mood and affect   ASSESSMENT:   Karen Villanueva is a 66 y.o. female who presents for the following: 1. Murmur   2. SOB (shortness of breath)    PLAN:   1. Murmur 2. SOB (shortness of breath) -Prominent 3 out of 6 holosystolic murmur.  Does not radiate into the carotids.  Suspect this is the murmur of mitral valve regurgitation.  I can hear in her axilla.  It does radiate anteriorly so I suspect this is a posterior flail leaflet.  She needs an echocardiogram which we will obtain in the next 1 to 2 weeks this will help gauge severity.  I would like to get a BNP as well as BMP and TSH.  She reports shortness of breath so may be developing early congestive heart failure.  She will see me back based on the results of her lab work as well as echocardiogram.  We will put her in for a 61-monthfollow-up but if I need to see her sooner I will.  Would recommend to proceed with early mitral valve repair if this is indeed a problem.  Disposition: Return in  about 6 months (around 12/08/2022).  Medication Adjustments/Labs and Tests Ordered: Current medicines are reviewed at length with the patient today.  Concerns regarding medicines are outlined above.  Orders Placed This Encounter  Procedures   Basic metabolic panel   Brain natriuretic peptide   TSH   EKG 12-Lead   ECHOCARDIOGRAM COMPLETE  No orders of the defined types were placed in this encounter.   Patient Instructions  Medication Instructions:  The current medical regimen is effective;  continue present plan and medications.  *If you need a refill on your cardiac medications before your next appointment, please call your pharmacy*   Lab Work: BMET, BNP, TSH today   If you have labs (blood work) drawn today and your tests are completely normal, you will receive your results only by: [place] (if you have MyChart) OR A paper copy in the mail If you have any lab test that is abnormal or we need to change your treatment, we will call you to review the results.   Testing/Procedures: Echocardiogram (asap, in the next 2 weeks) - Your physician has requested that you have an echocardiogram. Echocardiography is a painless test that uses sound waves to create images of your heart. It provides your doctor with information about the size and shape of your heart and how well your heart's chambers and valves are working. This procedure takes approximately one hour. There are no restrictions for this procedure.     Follow-Up: At [facility], you and your health needs are our priority.  As part of our continuing mission to provide you with exceptional heart care, we have created designated Provider Care Teams.  These Care Teams include your primary Cardiologist (physician) and Advanced Practice Providers (APPs -  Physician Assistants and Nurse Practitioners) who all work together to provide you with the care you need, when you need it.  We recommend signing up for the patient  portal called "MyChart".  Sign up information is provided on this After Visit Summary.  MyChart is used to connect with patients for Virtual Visits (Telemedicine).  Patients are able to view lab/test results, encounter notes, upcoming appointments, etc.  Non-urgent messages can be sent to your provider as well.   To learn more about what you can do with MyChart, go to [email or web address].    Your next appointment:   6 month(s)  The format for your next appointment:   In Person  Provider:   Eleonore Chiquito, MD           Signed, Addison Naegeli. Audie Box, MD, [place]  [address], [place], [place] [address] [phone]  06/08/2022 9:05 AM

## 2022-06-08 ENCOUNTER — Ambulatory Visit (INDEPENDENT_AMBULATORY_CARE_PROVIDER_SITE_OTHER): Payer: Medicare HMO | Admitting: Cardiovascular Disease

## 2022-06-08 ENCOUNTER — Encounter: Payer: Self-pay | Admitting: Cardiovascular Disease

## 2022-06-08 VITALS — BP 136/84 | HR 75 | Ht 68.0 in | Wt 150.8 lb

## 2022-06-08 DIAGNOSIS — R0602 Shortness of breath: Secondary | ICD-10-CM

## 2022-06-08 DIAGNOSIS — R011 Cardiac murmur, unspecified: Secondary | ICD-10-CM

## 2022-06-08 DIAGNOSIS — R0600 Dyspnea, unspecified: Secondary | ICD-10-CM | POA: Diagnosis not present

## 2022-06-08 NOTE — Patient Instructions (Signed)
Medication Instructions:  The current medical regimen is effective;  continue present plan and medications.  *If you need a refill on your cardiac medications before your next appointment, please call your pharmacy*   Lab Work: BMET, BNP, TSH today   If you have labs (blood work) drawn today and your tests are completely normal, you will receive your results only by: [place] (if you have MyChart) OR A paper copy in the mail If you have any lab test that is abnormal or we need to change your treatment, we will call you to review the results.   Testing/Procedures: Echocardiogram (asap, in the next 2 weeks) - Your physician has requested that you have an echocardiogram. Echocardiography is a painless test that uses sound waves to create images of your heart. It provides your doctor with information about the size and shape of your heart and how well your heart's chambers and valves are working. This procedure takes approximately one hour. There are no restrictions for this procedure.     Follow-Up: At [facility], you and your health needs are our priority.  As part of our continuing mission to provide you with exceptional heart care, we have created designated Provider Care Teams.  These Care Teams include your primary Cardiologist (physician) and Advanced Practice Providers (APPs -  Physician Assistants and Nurse Practitioners) who all work together to provide you with the care you need, when you need it.  We recommend signing up for the patient portal called "MyChart".  Sign up information is provided on this After Visit Summary.  MyChart is used to connect with patients for Virtual Visits (Telemedicine).  Patients are able to view lab/test results, encounter notes, upcoming appointments, etc.  Non-urgent messages can be sent to your provider as well.   To learn more about what you can do with MyChart, go to [email or web address].    Your next appointment:   6 month(s)  The  format for your next appointment:   In Person  Provider:   Eleonore Chiquito, MD

## 2022-06-09 DIAGNOSIS — S52551D Other extraarticular fracture of lower end of right radius, subsequent encounter for closed fracture with routine healing: Secondary | ICD-10-CM | POA: Diagnosis not present

## 2022-06-09 DIAGNOSIS — Z9889 Other specified postprocedural states: Secondary | ICD-10-CM | POA: Diagnosis not present

## 2022-06-11 DIAGNOSIS — S52501P Unspecified fracture of the lower end of right radius, subsequent encounter for closed fracture with malunion: Secondary | ICD-10-CM | POA: Diagnosis not present

## 2022-06-12 LAB — BASIC METABOLIC PANEL
BUN/Creatinine Ratio: 14 (ref 12–28)
BUN: 13 mg/dL (ref 8–27)
CO2: 20 mmol/L (ref 20–29)
Calcium: 9.5 mg/dL (ref 8.7–10.3)
Chloride: 105 mmol/L (ref 96–106)
Creatinine, Ser: 0.95 mg/dL (ref 0.57–1.00)
Glucose: 93 mg/dL (ref 70–99)
Potassium: 4.1 mmol/L (ref 3.5–5.2)
Sodium: 138 mmol/L (ref 134–144)
eGFR: 66 mL/min/{1.73_m2} (ref >59)

## 2022-06-12 LAB — BRAIN NATRIURETIC PEPTIDE

## 2022-06-12 LAB — TSH: TSH: 2.95 u[IU]/mL (ref 0.450–4.500)

## 2022-06-19 DIAGNOSIS — S52501P Unspecified fracture of the lower end of right radius, subsequent encounter for closed fracture with malunion: Secondary | ICD-10-CM | POA: Diagnosis not present

## 2022-06-22 ENCOUNTER — Ambulatory Visit (HOSPITAL_COMMUNITY): Payer: Medicare HMO | Attending: Internal Medicine

## 2022-06-22 DIAGNOSIS — R011 Cardiac murmur, unspecified: Secondary | ICD-10-CM | POA: Diagnosis not present

## 2022-06-22 LAB — ECHOCARDIOGRAM COMPLETE
Area-P 1/2: 3.85 cm2
MV M vel: 4.4 m/s
MV Peak grad: 77.4 mmHg
Radius: 0.85 cm
S' Lateral: 3.3 cm

## 2022-06-23 ENCOUNTER — Telehealth: Payer: Self-pay | Admitting: Cardiovascular Disease

## 2022-06-23 DIAGNOSIS — R0602 Shortness of breath: Secondary | ICD-10-CM

## 2022-06-23 DIAGNOSIS — S52501P Unspecified fracture of the lower end of right radius, subsequent encounter for closed fracture with malunion: Secondary | ICD-10-CM | POA: Diagnosis not present

## 2022-06-23 DIAGNOSIS — R944 Abnormal results of kidney function studies: Secondary | ICD-10-CM | POA: Diagnosis not present

## 2022-06-24 ENCOUNTER — Encounter (HOSPITAL_COMMUNITY): Payer: Self-pay | Admitting: Cardiovascular Disease

## 2022-06-24 ENCOUNTER — Encounter: Payer: Self-pay | Admitting: *Deleted

## 2022-06-24 NOTE — Addendum Note (Signed)
Addended by: Cristopher Estimable on: 06/24/2022 11:15 AM   Modules accepted: Orders

## 2022-06-24 NOTE — Telephone Encounter (Signed)
Spoke with pt, TEE scheduled for July 10th @ 11:30 am and cath is scheduled for 1:30 pm same day. Instructions discussed but patient preferred information sent to her my chart account. Aware she will need to come to the office for lab work prior to 07/03/22.

## 2022-06-29 DIAGNOSIS — S52501P Unspecified fracture of the lower end of right radius, subsequent encounter for closed fracture with malunion: Secondary | ICD-10-CM | POA: Diagnosis not present

## 2022-06-29 DIAGNOSIS — R0602 Shortness of breath: Secondary | ICD-10-CM | POA: Diagnosis not present

## 2022-06-29 LAB — CBC
Hematocrit: 34.5 % (ref 34.0–46.6)
Hemoglobin: 11 g/dL — ABNORMAL LOW (ref 11.1–15.9)
MCH: 25.2 pg — ABNORMAL LOW (ref 26.6–33.0)
MCHC: 31.9 g/dL (ref 31.5–35.7)
MCV: 79 fL (ref 79–97)
Platelets: 297 10*3/uL (ref 150–450)
RBC: 4.36 x10E6/uL (ref 3.77–5.28)
RDW: 15 % (ref 11.7–15.4)
WBC: 11.1 10*3/uL — ABNORMAL HIGH (ref 3.4–10.8)

## 2022-07-02 ENCOUNTER — Telehealth: Payer: Self-pay | Admitting: *Deleted

## 2022-07-02 NOTE — Telephone Encounter (Signed)
Cardiac Catheterization scheduled at [facility] for: Monday July 06, 2022 1:30 PM/TEE 11:30 AM Arrival time and place: [facility] Main Entrance A at: 10:30 AM   Nothing to eat or drink after midnight prior to procedures, may have sips of water to take medications.   Medication instructions: -Usual morning medications can be taken with sips of water including aspirin 81 mg.  Confirmed patient has responsible adult to drive home post procedure and be with patient first 24 hours after arriving home.  Patient reports no new symptoms concerning for COVID-19 in the past 10 days.  Reviewed procedure instructions with patient.

## 2022-07-06 ENCOUNTER — Encounter (HOSPITAL_COMMUNITY)
Admission: RE | Disposition: A | Discharge status: Home / Self Care | Payer: Self-pay | Source: Home / Self Care | Attending: Cardiovascular Disease

## 2022-07-06 ENCOUNTER — Other Ambulatory Visit: Payer: Self-pay

## 2022-07-06 ENCOUNTER — Encounter (HOSPITAL_COMMUNITY): Payer: Self-pay | Admitting: Cardiovascular Disease

## 2022-07-06 ENCOUNTER — Ambulatory Visit (HOSPITAL_BASED_OUTPATIENT_CLINIC_OR_DEPARTMENT_OTHER): Payer: Medicare HMO | Admitting: Anesthesiology

## 2022-07-06 ENCOUNTER — Ambulatory Visit (HOSPITAL_COMMUNITY): Payer: Medicare HMO | Admitting: Anesthesiology

## 2022-07-06 ENCOUNTER — Ambulatory Visit (HOSPITAL_COMMUNITY)
Admission: RE | Admit: 2022-07-06 | Discharge: 2022-07-06 | Disposition: A | Discharge status: Home / Self Care | Payer: Medicare HMO | Attending: Cardiovascular Disease | Admitting: Cardiovascular Disease

## 2022-07-06 ENCOUNTER — Ambulatory Visit (HOSPITAL_BASED_OUTPATIENT_CLINIC_OR_DEPARTMENT_OTHER)
Admission: RE | Admit: 2022-07-06 | Discharge: 2022-07-06 | Disposition: A | Discharge status: Home / Self Care | Payer: Medicare HMO | Source: Ambulatory Visit | Attending: Cardiovascular Disease | Admitting: Cardiovascular Disease

## 2022-07-06 DIAGNOSIS — G1221 Amyotrophic lateral sclerosis: Secondary | ICD-10-CM | POA: Insufficient documentation

## 2022-07-06 DIAGNOSIS — R0602 Shortness of breath: Secondary | ICD-10-CM | POA: Insufficient documentation

## 2022-07-06 DIAGNOSIS — I340 Nonrheumatic mitral (valve) insufficiency: Secondary | ICD-10-CM

## 2022-07-06 DIAGNOSIS — Z87891 Personal history of nicotine dependence: Secondary | ICD-10-CM | POA: Diagnosis not present

## 2022-07-06 DIAGNOSIS — I081 Rheumatic disorders of both mitral and tricuspid valves: Secondary | ICD-10-CM | POA: Diagnosis not present

## 2022-07-06 DIAGNOSIS — D649 Anemia, unspecified: Secondary | ICD-10-CM | POA: Diagnosis not present

## 2022-07-06 HISTORY — PX: TEE WITHOUT CARDIOVERSION: SHX5443

## 2022-07-06 HISTORY — PX: RIGHT/LEFT HEART CATH AND CORONARY ANGIOGRAPHY: CATH118266

## 2022-07-06 HISTORY — PX: BUBBLE STUDY: SHX6837

## 2022-07-06 LAB — POCT I-STAT EG7
Acid-base deficit: 5 mmol/L — ABNORMAL HIGH (ref 0.0–2.0)
Bicarbonate: 19.5 mmol/L — ABNORMAL LOW (ref 20.0–28.0)
Calcium, Ion: 1.24 mmol/L (ref 1.15–1.40)
HCT: 32 % — ABNORMAL LOW (ref 36.0–46.0)
Hemoglobin: 10.9 g/dL — ABNORMAL LOW (ref 12.0–15.0)
O2 Saturation: 63 %
Potassium: 3.9 mmol/L (ref 3.5–5.1)
Sodium: 144 mmol/L (ref 135–145)
TCO2: 21 mmol/L — ABNORMAL LOW (ref 22–32)
pCO2, Ven: 33.9 mmHg — ABNORMAL LOW (ref 44–60)
pH, Ven: 7.368 (ref 7.25–7.43)
pO2, Ven: 33 mmHg (ref 32–45)

## 2022-07-06 LAB — POCT I-STAT 7, (LYTES, BLD GAS, ICA,H+H)
Acid-base deficit: 7 mmol/L — ABNORMAL HIGH (ref 0.0–2.0)
Bicarbonate: 17.4 mmol/L — ABNORMAL LOW (ref 20.0–28.0)
Calcium, Ion: 1.11 mmol/L — ABNORMAL LOW (ref 1.15–1.40)
HCT: 29 % — ABNORMAL LOW (ref 36.0–46.0)
Hemoglobin: 9.9 g/dL — ABNORMAL LOW (ref 12.0–15.0)
O2 Saturation: 97 %
Potassium: 3.6 mmol/L (ref 3.5–5.1)
Sodium: 145 mmol/L (ref 135–145)
TCO2: 18 mmol/L — ABNORMAL LOW (ref 22–32)
pCO2 arterial: 29.7 mmHg — ABNORMAL LOW (ref 32–48)
pH, Arterial: 7.376 (ref 7.35–7.45)
pO2, Arterial: 87 mmHg (ref 83–108)

## 2022-07-06 LAB — ECHO TEE
MV M vel: 4.46 m/s
MV Peak grad: 79.6 mmHg
Radius: 1.4 cm

## 2022-07-06 SURGERY — ECHOCARDIOGRAM, TRANSESOPHAGEAL
Anesthesia: Monitor Anesthesia Care

## 2022-07-06 SURGERY — RIGHT/LEFT HEART CATH AND CORONARY ANGIOGRAPHY
Anesthesia: LOCAL

## 2022-07-06 MED ORDER — SODIUM CHLORIDE 0.9% FLUSH: 3.0000 mL | Freq: Two times a day (BID) | INTRAVENOUS | Status: DC

## 2022-07-06 MED ORDER — MIDAZOLAM HCL 2 MG/2ML IJ SOLN
INTRAMUSCULAR | Status: DC | PRN
  Administered 2022-07-06: 1 mg via INTRAVENOUS

## 2022-07-06 MED ORDER — LIDOCAINE HCL (CARDIAC) PF 100 MG/5ML IV SOSY
PREFILLED_SYRINGE | INTRAVENOUS | Status: DC | PRN
  Administered 2022-07-06: 40 mg via INTRAVENOUS

## 2022-07-06 MED ORDER — SODIUM CHLORIDE 0.9% FLUSH
3.0000 mL | INTRAVENOUS | Status: DC | PRN
Start: 2022-07-06 — End: 2022-07-06

## 2022-07-06 MED ORDER — HYDRALAZINE HCL 20 MG/ML IJ SOLN: 10.0000 mg | INTRAMUSCULAR | Status: DC | PRN

## 2022-07-06 MED ORDER — PROPOFOL 10 MG/ML IV BOLUS
INTRAVENOUS | Status: DC | PRN
  Administered 2022-07-06 (×2): 20 mg via INTRAVENOUS

## 2022-07-06 MED ORDER — SODIUM CHLORIDE 0.9 % WEIGHT BASED INFUSION
3.0000 mL/kg/h | INTRAVENOUS | Status: AC
Start: 2022-07-06 — End: 2022-07-06

## 2022-07-06 MED ORDER — BUTAMBEN-TETRACAINE-BENZOCAINE 2-2-14 % EX AERO
INHALATION_SPRAY | CUTANEOUS | Status: DC | PRN
  Administered 2022-07-06: 1 via TOPICAL

## 2022-07-06 MED ORDER — IOHEXOL 350 MG/ML SOLN
INTRAVENOUS | Status: DC | PRN
  Administered 2022-07-06: 30 mL

## 2022-07-06 MED ORDER — VERAPAMIL HCL 2.5 MG/ML IV SOLN
INTRAVENOUS | Status: AC
  Filled 2022-07-06: qty 2

## 2022-07-06 MED ORDER — HEPARIN (PORCINE) IN NACL 1000-0.9 UT/500ML-% IV SOLN
INTRAVENOUS | Status: AC
  Filled 2022-07-06: qty 1000

## 2022-07-06 MED ORDER — HEPARIN (PORCINE) IN NACL 1000-0.9 UT/500ML-% IV SOLN
INTRAVENOUS | Status: DC | PRN
  Administered 2022-07-06 (×2): 500 mL

## 2022-07-06 MED ORDER — ACETAMINOPHEN 325 MG PO TABS: 650.0000 mg | ORAL_TABLET | ORAL | Status: DC | PRN

## 2022-07-06 MED ORDER — FENTANYL CITRATE (PF) 100 MCG/2ML IJ SOLN
INTRAMUSCULAR | Status: AC
  Filled 2022-07-06: qty 2

## 2022-07-06 MED ORDER — SODIUM CHLORIDE 0.9 % IV SOLN: INTRAVENOUS | Status: AC

## 2022-07-06 MED ORDER — LABETALOL HCL 5 MG/ML IV SOLN: 10.0000 mg | INTRAVENOUS | Status: DC | PRN

## 2022-07-06 MED ORDER — HEPARIN SODIUM (PORCINE) 1000 UNIT/ML IJ SOLN
INTRAMUSCULAR | Status: AC
  Filled 2022-07-06: qty 10

## 2022-07-06 MED ORDER — SODIUM CHLORIDE 0.9 % IV SOLN: 250.0000 mL | INTRAVENOUS | Status: DC | PRN

## 2022-07-06 MED ORDER — FENTANYL CITRATE (PF) 100 MCG/2ML IJ SOLN
INTRAMUSCULAR | Status: DC | PRN
  Administered 2022-07-06: 25 ug via INTRAVENOUS

## 2022-07-06 MED ORDER — MIDAZOLAM HCL 2 MG/2ML IJ SOLN
INTRAMUSCULAR | Status: AC
  Filled 2022-07-06: qty 2

## 2022-07-06 MED ORDER — ASPIRIN 81 MG PO CHEW: 81.0000 mg | CHEWABLE_TABLET | ORAL | Status: DC

## 2022-07-06 MED ORDER — PROPOFOL 500 MG/50ML IV EMUL
INTRAVENOUS | Status: DC | PRN
  Administered 2022-07-06: 125 ug/kg/min via INTRAVENOUS

## 2022-07-06 MED ORDER — HEPARIN SODIUM (PORCINE) 1000 UNIT/ML IJ SOLN
INTRAMUSCULAR | Status: DC | PRN
  Administered 2022-07-06: 3500 [IU] via INTRAVENOUS

## 2022-07-06 MED ORDER — SODIUM CHLORIDE 0.9 % WEIGHT BASED INFUSION: 1.0000 mL/kg/h | INTRAVENOUS | Status: DC

## 2022-07-06 MED ORDER — LIDOCAINE HCL (PF) 1 % IJ SOLN
INTRAMUSCULAR | Status: DC | PRN
  Administered 2022-07-06 (×2): 2 mL

## 2022-07-06 MED ORDER — VERAPAMIL HCL 2.5 MG/ML IV SOLN
INTRAVENOUS | Status: DC | PRN
  Administered 2022-07-06: 10 mL via INTRA_ARTERIAL

## 2022-07-06 MED ORDER — ONDANSETRON HCL 4 MG/2ML IJ SOLN: 4.0000 mg | Freq: Four times a day (QID) | INTRAMUSCULAR | Status: DC | PRN

## 2022-07-06 SURGICAL SUPPLY — 12 items
BAND CMPR LRG ZPHR (HEMOSTASIS) ×1
BAND ZEPHYR COMPRESS 30 LONG (HEMOSTASIS) ×1 IMPLANT
CATH 5FR JL3.5 JR4 ANG PIG MP (CATHETERS) ×1 IMPLANT
CATH BALLN WEDGE 5F 110CM (CATHETERS) ×1 IMPLANT
GLIDESHEATH SLEND SS 6F .021 (SHEATH) ×1 IMPLANT
GUIDEWIRE INQWIRE 1.5J.035X260 (WIRE) IMPLANT
INQWIRE 1.5J .035X260CM (WIRE) ×2
KIT HEART LEFT (KITS) ×3 IMPLANT
PACK CARDIAC CATHETERIZATION (CUSTOM PROCEDURE TRAY) ×3 IMPLANT
SHEATH GLIDE SLENDER 4/5FR (SHEATH) ×1 IMPLANT
TRANSDUCER W/STOPCOCK (MISCELLANEOUS) ×3 IMPLANT
TUBING CIL FLEX 10 FLL-RA (TUBING) ×3 IMPLANT

## 2022-07-06 NOTE — Discharge Instructions (Signed)
TEE  YOU HAD AN CARDIAC PROCEDURE TODAY: Refer to the procedure report and other information in the discharge instructions given to you for any specific questions about what was found during the examination. If this information does not answer your questions, please call [place] office at [phone] to clarify.   DIET: Your first meal following the procedure should be a light meal and then it is ok to progress to your normal diet. A half-sandwich or bowl of soup is an example of a good first meal. Heavy or fried foods are harder to digest and may make you feel nauseous or bloated. Drink plenty of fluids but you should avoid alcoholic beverages for 24 hours. If you had a esophageal dilation, please see attached instructions for diet.   ACTIVITY: Your care partner should take you home directly after the procedure. You should plan to take it easy, moving slowly for the rest of the day. You can resume normal activity the day after the procedure however YOU SHOULD NOT DRIVE, use power tools, machinery or perform tasks that involve climbing or major physical exertion for 24 hours (because of the sedation medicines used during the test).   SYMPTOMS TO REPORT IMMEDIATELY: A cardiologist can be reached at any hour. Please call [phone] for any of the following symptoms:  Vomiting of blood or coffee ground material  New, significant abdominal pain  New, significant chest pain or pain under the shoulder blades  Painful or persistently difficult swallowing  New shortness of breath  Black, tarry-looking or red, bloody stools  FOLLOW UP:  Please also call with any specific questions about appointments or follow up tests.

## 2022-07-06 NOTE — Transfer of Care (Signed)
Immediate Anesthesia Transfer of Care Note  Patient: Karen Villanueva  Procedure(s) Performed: TRANSESOPHAGEAL ECHOCARDIOGRAM (TEE) BUBBLE STUDY  Patient Location: PACU  Anesthesia Type:MAC  Level of Consciousness: drowsy  Airway & Oxygen Therapy: Patient Spontanous Breathing and Patient connected to face mask oxygen  Post-op Assessment: Report given to RN and Post -op Vital signs reviewed and stable  Post vital signs: Reviewed and stable  Last Vitals:  Vitals Value Taken Time  BP 113/74 07/06/22 1135  Temp    Pulse 73 07/06/22 1136  Resp 26 07/06/22 1136  SpO2 98 % 07/06/22 1136  Vitals shown include unvalidated device data.  Last Pain:  Vitals:   07/06/22 1135  TempSrc:   PainSc: 0-No pain         Complications: No notable events documented.

## 2022-07-06 NOTE — Anesthesia Preprocedure Evaluation (Addendum)
Anesthesia Evaluation  Patient identified by MRN, date of birth, ID band Patient awake    Reviewed: Allergy & Precautions, NPO status , Patient's Chart, lab work & pertinent test results  History of Anesthesia Complications Negative for: history of anesthetic complications  Airway Mallampati: II  TM Distance: >3 FB Neck ROM: Full    Dental  (+) Caps, Dental Advisory Given   Pulmonary neg pulmonary ROS, former smoker,    Pulmonary exam normal        Cardiovascular + Valvular Problems/Murmurs MR  Rhythm:Regular + Systolic murmurs IMPRESSIONS    1. Left ventricular ejection fraction, by estimation, is 65 to 70%. The  left ventricle has normal function. The left ventricle has no regional  wall motion abnormalities. The left ventricular internal cavity size was  mildly dilated. There is mild left  ventricular hypertrophy. Left ventricular diastolic parameters are  consistent with Grade III diastolic dysfunction (restrictive). Elevated  left atrial pressure.  2. Right ventricular systolic function is normal. The right ventricular  size is normal. There is severely elevated pulmonary artery systolic  pressure.  3. Left atrial size was severely dilated.  4. Right atrial size was mildly dilated.  5. Severe prolapse/partially flail posterior mitral leaflet with  torrential MR directed anteriorly and around left atrium. Recommend TEE to  further define mitral valve.  6. The aortic valve is tricuspid. Aortic valve regurgitation is not  visualized. Aortic valve sclerosis/calcification is present, without any  evidence of aortic stenosis.  7. Pulmonic valve regurgitation is moderate.  8. The inferior vena cava is normal in size with <50% respiratory  variability, suggesting right atrial pressure of 8 mmHg.    Neuro/Psych ALS negative neurological ROS     GI/Hepatic negative GI ROS, Neg liver ROS,   Endo/Other  negative  endocrine ROS  Renal/GU negative Renal ROS     Musculoskeletal negative musculoskeletal ROS (+)   Abdominal   Peds  Hematology  (+) Blood dyscrasia, anemia ,   Anesthesia Other Findings   Reproductive/Obstetrics                            Anesthesia Physical Anesthesia Plan  ASA: 3  Anesthesia Plan: MAC   Post-op Pain Management: Minimal or no pain anticipated   Induction:   PONV Risk Score and Plan: Ondansetron, Propofol infusion and TIVA  Airway Management Planned: Natural Airway  Additional Equipment:   Intra-op Plan:   Post-operative Plan:   Informed Consent: I have reviewed the patients History and Physical, chart, labs and discussed the procedure including the risks, benefits and alternatives for the proposed anesthesia with the patient or authorized representative who has indicated his/her understanding and acceptance.     Dental advisory given  Plan Discussed with: Anesthesiologist  Anesthesia Plan Comments:         Anesthesia Quick Evaluation

## 2022-07-06 NOTE — Interval H&P Note (Signed)
History and Physical Interval Note:  07/06/2022 10:39 AM  Karen Villanueva  has presented today for surgery, with the diagnosis of severe mitral valve disorder.  The various methods of treatment have been discussed with the patient and family. After consideration of risks, benefits and other options for treatment, the patient has consented to  Procedure(s): TRANSESOPHAGEAL ECHOCARDIOGRAM (TEE) (N/A) as a surgical intervention.  The patient's history has been reviewed, patient examined, no change in status, stable for surgery.  I have reviewed the patient's chart and labs.  Questions were answered to the patient's satisfaction.    TEE for severe MR. LHC too.   [place] Bells T. Audie Box, MD, [place]  [address], [place],  [address] [phone]  10:39 AM

## 2022-07-06 NOTE — CV Procedure (Signed)
    TRANSESOPHAGEAL ECHOCARDIOGRAM   NAME:  Karen Villanueva    MRN: [id number] DOB:  1956/11/13    ADMIT DATE: 07/06/2022  INDICATIONS: Severe MR  PROCEDURE:   Informed consent was obtained prior to the procedure. The risks, benefits and alternatives for the procedure were discussed and the patient comprehended these risks.  Risks include, but are not limited to, cough, sore throat, vomiting, nausea, somnolence, esophageal and stomach trauma or perforation, bleeding, low blood pressure, aspiration, pneumonia, infection, trauma to the teeth and death.    Procedural time out performed. The oropharynx was anesthetized with topical 1% benzocaine.    Anesthesia was administered by Dr. Tobias Alexander.  The patient was administered 270 mg of propofol and 40 mg of lidocaine to achieve and maintain moderate conscious sedation.  The patient's heart rate, blood pressure, and oxygen saturation are monitored continuously during the procedure. The period of conscious sedation is 15 minutes, of which I was present face-to-face 100% of this time.   The transesophageal probe was inserted in the esophagus and stomach without difficulty and multiple views were obtained.   COMPLICATIONS:    There were no immediate complications.  KEY FINDINGS:  Severe MR. Flail P2-3 segment.  LVEF 58%.  Full report to follow. Further management per primary team.   [place] Bells T. Audie Box, MD, [place]  [address], [place], [place] [address] [phone]  11:31 AM

## 2022-07-06 NOTE — Interval H&P Note (Signed)
History and Physical Interval Note:  07/06/2022 12:27 PM  Karen Villanueva  has presented today for surgery, with the diagnosis of mr.  The various methods of treatment have been discussed with the patient and family. After consideration of risks, benefits and other options for treatment, the patient has consented to  Procedure(s): RIGHT/LEFT HEART CATH AND CORONARY ANGIOGRAPHY (N/A) as a surgical intervention.  The patient's history has been reviewed, patient examined, no change in status, stable for surgery.  I have reviewed the patient's chart and labs.  Questions were answered to the patient's satisfaction.    Cath Lab Visit (complete for each Cath Lab visit)  Clinical Evaluation Leading to the Procedure:   ACS: No.  Non-ACS:    Anginal Classification: CCS II  Anti-ischemic medical therapy: No Therapy  Non-Invasive Test Results: No non-invasive testing performed  Prior CABG: No previous CABG        Lauree Chandler

## 2022-07-07 ENCOUNTER — Telehealth: Payer: Self-pay

## 2022-07-07 ENCOUNTER — Encounter (HOSPITAL_COMMUNITY): Payer: Self-pay | Admitting: Cardiovascular Disease

## 2022-07-07 DIAGNOSIS — I340 Nonrheumatic mitral (valve) insufficiency: Secondary | ICD-10-CM

## 2022-07-07 NOTE — Anesthesia Postprocedure Evaluation (Signed)
Anesthesia Post Note  Patient: Karen Villanueva  Procedure(s) Performed: TRANSESOPHAGEAL ECHOCARDIOGRAM (TEE) BUBBLE STUDY     Patient location during evaluation: Endoscopy Anesthesia Type: MAC Level of consciousness: awake and alert Pain management: pain level controlled Vital Signs Assessment: post-procedure vital signs reviewed and stable Respiratory status: spontaneous breathing, nonlabored ventilation, respiratory function stable and patient connected to nasal cannula oxygen Cardiovascular status: stable and blood pressure returned to baseline Postop Assessment: no apparent nausea or vomiting Anesthetic complications: no   No notable events documented.  Last Vitals:  Vitals:   07/06/22 1520 07/06/22 1619  BP: 124/80 135/89  Pulse: 71 66  Resp:    Temp:    SpO2: 97% 95%    Last Pain:  Vitals:   07/06/22 1330  TempSrc:   PainSc: 0-No pain                 [name]

## 2022-07-07 NOTE — Telephone Encounter (Signed)
Discussed with Dr. Audie Box. Referral placed to Dr. Cheree Ditto at [facility] for evaluation of SMR. Imaging powershared and Dr. Aundra Millet nurse notified.

## 2022-07-14 DIAGNOSIS — M25512 Pain in left shoulder: Secondary | ICD-10-CM | POA: Diagnosis not present

## 2022-07-14 DIAGNOSIS — S52501D Unspecified fracture of the lower end of right radius, subsequent encounter for closed fracture with routine healing: Secondary | ICD-10-CM | POA: Diagnosis not present

## 2022-07-15 DIAGNOSIS — H401111 Primary open-angle glaucoma, right eye, mild stage: Secondary | ICD-10-CM | POA: Diagnosis not present

## 2022-07-20 DIAGNOSIS — Z87891 Personal history of nicotine dependence: Secondary | ICD-10-CM | POA: Diagnosis not present

## 2022-07-20 DIAGNOSIS — R0602 Shortness of breath: Secondary | ICD-10-CM | POA: Diagnosis not present

## 2022-07-20 DIAGNOSIS — I340 Nonrheumatic mitral (valve) insufficiency: Secondary | ICD-10-CM | POA: Diagnosis not present

## 2022-07-20 DIAGNOSIS — R0989 Other specified symptoms and signs involving the circulatory and respiratory systems: Secondary | ICD-10-CM | POA: Diagnosis not present

## 2022-07-20 DIAGNOSIS — I371 Nonrheumatic pulmonary valve insufficiency: Secondary | ICD-10-CM | POA: Diagnosis not present

## 2022-07-20 DIAGNOSIS — R531 Weakness: Secondary | ICD-10-CM | POA: Diagnosis not present

## 2022-07-20 DIAGNOSIS — G1221 Amyotrophic lateral sclerosis: Secondary | ICD-10-CM | POA: Diagnosis not present

## 2022-07-28 NOTE — Telephone Encounter (Signed)
Spoke with Dr. Aundra Millet nurse, who requests carotids are done at [facility] so the patient does not have to travel to [place] orders to be done prior to August 20.

## 2022-07-28 NOTE — Telephone Encounter (Signed)
Confirmed carotid ultrasound appointment on 8/7 at 0800. The patient understands to arrive 15 minutes early for check-in. She was grateful for assistance.

## 2022-07-28 NOTE — Addendum Note (Signed)
Addended by: Harland German A on: 07/28/2022 08:50 AM   Modules accepted: Orders

## 2022-07-30 ENCOUNTER — Telehealth: Payer: Self-pay

## 2022-07-30 NOTE — Telephone Encounter (Signed)
See MyChart messages.   Spoke with both the patient and scheduler several times.   Apologized to the patient for phone not working and confirmed correct phone number. After confirming the first appointment, a message was left on the scheduler's line that she needed to be rescheduled to 8/9 or 8/10.  The scheduler arranged her carotids for 8/10 at noon and left message for the patient to call back to confirm. The patient sent several MyChart messages directly to me (bypassing the scheduler). Reiterated to the patient that I do not schedule ultrasounds and that the scheduler received a voicemail from her to reschedule to 8/10. The patient states she did not leave such message to reschedule and said she wanted to keep her 8/7 appointment. Informed her that appointment is no longer available.  After much discussion, the patient agreed to keep appointment 8/10 at noon.   Informed the scheduler not to call her again, that her appointment is confirmed.

## 2022-08-03 ENCOUNTER — Encounter (HOSPITAL_COMMUNITY): Payer: Medicare HMO

## 2022-08-06 ENCOUNTER — Ambulatory Visit (HOSPITAL_COMMUNITY)
Admission: RE | Admit: 2022-08-06 | Discharge: 2022-08-06 | Disposition: A | Discharge status: Home / Self Care | Payer: Medicare HMO | Source: Ambulatory Visit | Attending: Cardiology | Admitting: Cardiology

## 2022-08-06 ENCOUNTER — Other Ambulatory Visit: Payer: Self-pay | Admitting: Cardiovascular Disease

## 2022-08-06 DIAGNOSIS — I6523 Occlusion and stenosis of bilateral carotid arteries: Secondary | ICD-10-CM | POA: Insufficient documentation

## 2022-08-06 DIAGNOSIS — R0989 Other specified symptoms and signs involving the circulatory and respiratory systems: Secondary | ICD-10-CM | POA: Diagnosis not present

## 2022-08-11 DIAGNOSIS — S52501D Unspecified fracture of the lower end of right radius, subsequent encounter for closed fracture with routine healing: Secondary | ICD-10-CM | POA: Diagnosis not present

## 2022-08-11 DIAGNOSIS — M7062 Trochanteric bursitis, left hip: Secondary | ICD-10-CM | POA: Diagnosis not present

## 2022-08-11 DIAGNOSIS — Z9889 Other specified postprocedural states: Secondary | ICD-10-CM | POA: Diagnosis not present

## 2022-08-11 DIAGNOSIS — M25552 Pain in left hip: Secondary | ICD-10-CM | POA: Diagnosis not present

## 2022-08-16 DIAGNOSIS — K900 Celiac disease: Secondary | ICD-10-CM | POA: Diagnosis not present

## 2022-08-16 DIAGNOSIS — J90 Pleural effusion, not elsewhere classified: Secondary | ICD-10-CM | POA: Diagnosis not present

## 2022-08-16 DIAGNOSIS — R739 Hyperglycemia, unspecified: Secondary | ICD-10-CM | POA: Diagnosis not present

## 2022-08-16 DIAGNOSIS — R197 Diarrhea, unspecified: Secondary | ICD-10-CM | POA: Diagnosis not present

## 2022-08-16 DIAGNOSIS — G8912 Acute post-thoracotomy pain: Secondary | ICD-10-CM | POA: Diagnosis not present

## 2022-08-16 DIAGNOSIS — I480 Paroxysmal atrial fibrillation: Secondary | ICD-10-CM | POA: Diagnosis not present

## 2022-08-16 DIAGNOSIS — D6489 Other specified anemias: Secondary | ICD-10-CM | POA: Diagnosis not present

## 2022-08-16 DIAGNOSIS — Z9911 Dependence on respirator [ventilator] status: Secondary | ICD-10-CM | POA: Diagnosis not present

## 2022-08-16 DIAGNOSIS — J982 Interstitial emphysema: Secondary | ICD-10-CM | POA: Diagnosis not present

## 2022-08-16 DIAGNOSIS — E785 Hyperlipidemia, unspecified: Secondary | ICD-10-CM | POA: Diagnosis not present

## 2022-08-16 DIAGNOSIS — I5181 Takotsubo syndrome: Secondary | ICD-10-CM | POA: Diagnosis not present

## 2022-08-16 DIAGNOSIS — I50810 Right heart failure, unspecified: Secondary | ICD-10-CM | POA: Diagnosis not present

## 2022-08-16 DIAGNOSIS — J9811 Atelectasis: Secondary | ICD-10-CM | POA: Diagnosis not present

## 2022-08-16 DIAGNOSIS — Z452 Encounter for adjustment and management of vascular access device: Secondary | ICD-10-CM | POA: Diagnosis not present

## 2022-08-16 DIAGNOSIS — G8918 Other acute postprocedural pain: Secondary | ICD-10-CM | POA: Diagnosis not present

## 2022-08-16 DIAGNOSIS — Z4682 Encounter for fitting and adjustment of non-vascular catheter: Secondary | ICD-10-CM | POA: Diagnosis not present

## 2022-08-16 DIAGNOSIS — I517 Cardiomegaly: Secondary | ICD-10-CM | POA: Diagnosis not present

## 2022-08-16 DIAGNOSIS — J811 Chronic pulmonary edema: Secondary | ICD-10-CM | POA: Diagnosis not present

## 2022-08-16 DIAGNOSIS — J948 Other specified pleural conditions: Secondary | ICD-10-CM | POA: Diagnosis not present

## 2022-08-16 DIAGNOSIS — I511 Rupture of chordae tendineae, not elsewhere classified: Secondary | ICD-10-CM | POA: Diagnosis not present

## 2022-08-16 DIAGNOSIS — E1165 Type 2 diabetes mellitus with hyperglycemia: Secondary | ICD-10-CM | POA: Diagnosis not present

## 2022-08-16 DIAGNOSIS — I371 Nonrheumatic pulmonary valve insufficiency: Secondary | ICD-10-CM | POA: Diagnosis not present

## 2022-08-16 DIAGNOSIS — I739 Peripheral vascular disease, unspecified: Secondary | ICD-10-CM | POA: Diagnosis not present

## 2022-08-16 DIAGNOSIS — I97190 Other postprocedural cardiac functional disturbances following cardiac surgery: Secondary | ICD-10-CM | POA: Diagnosis not present

## 2022-08-16 DIAGNOSIS — Z9889 Other specified postprocedural states: Secondary | ICD-10-CM | POA: Diagnosis not present

## 2022-08-16 DIAGNOSIS — R0989 Other specified symptoms and signs involving the circulatory and respiratory systems: Secondary | ICD-10-CM | POA: Diagnosis not present

## 2022-08-16 DIAGNOSIS — I361 Nonrheumatic tricuspid (valve) insufficiency: Secondary | ICD-10-CM | POA: Diagnosis not present

## 2022-08-16 DIAGNOSIS — D689 Coagulation defect, unspecified: Secondary | ICD-10-CM | POA: Diagnosis not present

## 2022-08-16 DIAGNOSIS — I340 Nonrheumatic mitral (valve) insufficiency: Secondary | ICD-10-CM | POA: Diagnosis not present

## 2022-09-04 DIAGNOSIS — R0989 Other specified symptoms and signs involving the circulatory and respiratory systems: Secondary | ICD-10-CM | POA: Diagnosis not present

## 2022-09-04 DIAGNOSIS — I340 Nonrheumatic mitral (valve) insufficiency: Secondary | ICD-10-CM | POA: Diagnosis not present

## 2022-09-07 DIAGNOSIS — I4891 Unspecified atrial fibrillation: Secondary | ICD-10-CM | POA: Diagnosis not present

## 2022-09-07 DIAGNOSIS — J9811 Atelectasis: Secondary | ICD-10-CM | POA: Diagnosis not present

## 2022-09-07 DIAGNOSIS — Z48812 Encounter for surgical aftercare following surgery on the circulatory system: Secondary | ICD-10-CM | POA: Diagnosis not present

## 2022-09-07 DIAGNOSIS — Z9889 Other specified postprocedural states: Secondary | ICD-10-CM | POA: Diagnosis not present

## 2022-09-07 DIAGNOSIS — D62 Acute posthemorrhagic anemia: Secondary | ICD-10-CM | POA: Diagnosis not present

## 2022-09-07 DIAGNOSIS — I517 Cardiomegaly: Secondary | ICD-10-CM | POA: Diagnosis not present

## 2022-09-10 ENCOUNTER — Telehealth (HOSPITAL_COMMUNITY): Payer: Self-pay | Admitting: *Deleted

## 2022-09-10 NOTE — Telephone Encounter (Signed)
Returned call to pt from message left on Cardiac Rehab departmental voicemail. Spoke to pt who is interested in participating in CR s/p MV Repair on 8/21 at [facility] by Dr. Cheree Ditto.  Pt seen in follow up on 9/11 and is doing well post operatively. Pt has not completed follow up with cardiology. Pt has local cardiologist - Dr. Marisue Ivan.  Has an appt scheduled with him in December.  Trying to get an appt sooner - asked for help.  Called and able to get a follow up appt with jesse cleaver on 9/19 at 11:20.  Pt is in agreement of this appt.  Pt made aware of the wait times which can vary.  Will need electronic referral from Dr. Marisue Ivan and 12 lead EKG.  Pt verbalized understanding. Appreciated the help. Cherre Huger, BSN Cardiac and Training and development officer

## 2022-09-13 NOTE — Progress Notes (Unsigned)
Cardiology Clinic Note   Patient Name: Karen Villanueva Date of Encounter: 09/15/2022  Primary Care Provider:  Almedia Balls, NP Primary Cardiologist:  Evalina Field, MD  Patient Profile    Karen Villanueva 66 year old female presents to the clinic for follow-up status post mitral valve repair on 08/17/2022 at [facility].  Past Medical History    Past Medical History:  Diagnosis Date   ALS (amyotrophic lateral sclerosis) ([place])    Celiac disease    Closed fracture of left distal radius    Left ulnar fracture    left   Osteoporosis    Past Surgical History:  Procedure Laterality Date   ABDOMINAL HYSTERECTOMY  1994   BUBBLE STUDY  07/06/2022   Procedure: BUBBLE STUDY;  Surgeon: Geralynn Rile, MD;  Location: [place];  Service: Cardiovascular;;   FEMUR FRACTURE SURGERY Right    OPEN REDUCTION INTERNAL FIXATION (ORIF) DISTAL RADIAL FRACTURE Left 10/31/2019   Procedure: OPEN REDUCTION INTERNAL FIXATION (ORIF) LEFT DISTAL RADIAL FRACTURE,  CLOSED REDUCTION LEFT ULNA;  Surgeon: Leanora Cover, MD;  Location: [place];  Service: Orthopedics;  Laterality: Left;   RIGHT/LEFT HEART CATH AND CORONARY ANGIOGRAPHY N/A 07/06/2022   Procedure: RIGHT/LEFT HEART CATH AND CORONARY ANGIOGRAPHY;  Surgeon: Burnell Blanks, MD;  Location: [place] CV [facility];  Service: Cardiovascular;  Laterality: N/A;   TEE WITHOUT CARDIOVERSION N/A 07/06/2022   Procedure: TRANSESOPHAGEAL ECHOCARDIOGRAM (TEE);  Surgeon: Geralynn Rile, MD;  Location: [place];  Service: Cardiovascular;  Laterality: N/A;    Allergies  Allergies  Allergen Reactions   Wheat Bran Diarrhea    Celiac Disease, NO GLUTEN     History of Present Illness    Karen Villanueva has a PMH of severe mitral valve regurgitation status post MVR at [facility] 08/17/2022.  Her PMH also includes ALS, celiac's disease, left ulnar fracture, and osteoporosis.  She was referred by her PCP for evaluation of cardiac murmur.   Her EKG was normal.  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  Her ALS is moderate and she is fully independent.  She previously denied chest pain.  She did note some shortness of breath with laying down at night.  On evaluation she was noted to have a 3/6 holosystolic murmur.  It did not radiate to her carotids.  Mitral valve regurgitation was suspected.  She denied other cardiac symptoms.  She has never smoked.  She does not drink excessive alcohol.  She denied drug use.  She is married and has no children.  She underwent TEE on 07/06/2022.  She was noted to have severe MR.  She underwent mitral valve repair at [facility] on 08-17-22.  She was referred to cardiac rehab after repair.  She presents to the clinic today for follow-up evaluation and states she feels well after her mitral valve repair.  She continues to notice slight chest discomfort with deep inspiration.  She has been increasing her walking at home.  She has been walking around 15 to 20 minutes/day.  She is here today for evaluation preceding cardiac rehab.  Her blood pressure is well controlled.  I will have her continue her current diet and slowly increase her physical activity.  We will plan for follow-up as scheduled.  Today she denies chest pain, shortness of breath, lower extremity edema, fatigue, palpitations, melena, hematuria, hemoptysis, diaphoresis, weakness, presyncope, syncope, orthopnea, and PND.     Home Medications    Prior to Admission medications  Medication Sig Start Date End Date Taking? Authorizing Provider  Ascorbic Acid (VITAMIN C PO) Take 1 tablet by mouth 4 (four) times a week.    [provider]  aspirin 81 MG chewable tablet Chew 81 mg by mouth once.    [provider]  diphenhydramine-acetaminophen (TYLENOL PM) 25-500 MG TABS tablet Take 2 tablets by mouth at bedtime as needed (sleep).    [provider]  Melatonin 5 MG TABS Take 5 mg by mouth at bedtime as needed (sleep).    [provider]  Tafluprost, PF, (ZIOPTAN) 0.0015 % SOLN Place 1 drop into both eyes at bedtime.    [provider]    Family History    Family History  Problem Relation Age of Onset   Heart failure Mother    Diabetes Father    She indicated that her mother is deceased. She indicated that her father is deceased.  Social History    Social History   Socioeconomic History   Marital status: Married    Spouse name: Not on file   Number of children: 0   Years of education: Not on file   Highest education level: Not on file  Occupational History   Occupation: Part time Scientist, water quality - [employer]  Tobacco Use   Smoking status: Former   Smokeless tobacco: Never  Scientific laboratory technician Use: Never used  Substance and Sexual Activity   Alcohol use: Not Currently   Drug use: Never   Sexual activity: Not on file  Other Topics Concern   Not on file  Social History Narrative   Not on file   Social Determinants of Health   Financial Resource Strain: Not on file  Food Insecurity: Not on file  Transportation Needs: Not on file  Physical Activity: Not on file  Stress: Not on file  Social Connections: Not on file  Intimate Partner Violence: Not on file     Review of Systems    General:  No chills, fever, night sweats or weight changes.  Cardiovascular:  No chest pain, dyspnea on exertion, edema, orthopnea, palpitations, paroxysmal nocturnal dyspnea. Dermatological: No rash, lesions/masses Respiratory: No cough, dyspnea Urologic: No hematuria, dysuria Abdominal:   No nausea, vomiting, diarrhea, bright red blood per rectum, melena, or hematemesis Neurologic:  No visual changes, wkns, changes in mental status. All other systems reviewed and are otherwise negative except as noted above.  Physical Exam    VS:  BP 128/88   Pulse 90   Ht 5' 8"  (1.727 m)   Wt 137 lb (62.1 kg)   SpO2 95%   BMI 20.83 kg/m  , BMI Body mass index is 20.83 kg/m. GEN: Well nourished, well developed, in no  acute distress. HEENT: normal. Neck: Supple, no JVD, carotid bruits, or masses. Cardiac: RRR, no murmurs, rubs, or gallops. No clubbing, cyanosis, edema.  Radials/DP/PT 2+ and equal bilaterally.  Respiratory:  Respirations regular and unlabored, clear to auscultation bilaterally. GI: Soft, nontender, nondistended, BS + x 4. MS: no deformity or atrophy. Skin: warm and dry, no rash. Neuro:  Strength and sensation are intact. Psych: Normal affect.  Accessory Clinical Findings    Recent Labs: 06/08/2022: BNP CANCELED; BUN 13; Creatinine, Ser 0.95; TSH 2.950 06/29/2022: Platelets 297 07/06/2022: Hemoglobin 9.9; Potassium 3.6; Sodium 145   Recent Lipid Panel    Component Value Date/Time   CHOL 202 (H) 06/13/2019 0904   TRIG 75 06/13/2019 0904   HDL 46 06/13/2019 0904   CHOLHDL 4.4  06/13/2019 0904   LDLCALC 141 (H) 06/13/2019 0904         ECG personally reviewed by me today-normal sinus rhythm possible left atrial enlargement 90 bpm no ectopy.- No acute changes  TEE 07/06/2022  IMPRESSIONS     1. 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 There is  severe, torrential mitral regurgitation. 2D VC 1.3 cm. 2D PISA radius 1.4  cm. 2D ERO 0.87 cm2, R vol 127 cc (overestimated due to eccentric MR).  There is systolic reversal of pulmonary  flow in all 4 pulmonary veins. The flail gap is 7.7 mm. The flail width is  14.4 mm. The mitral valve is myxomatous. Severe mitral valve  regurgitation. No evidence of mitral stenosis.   2. Left ventricular ejection fraction, by estimation, is 55 to 60%. The  left ventricle has normal function.   3. Right ventricular systolic function is normal. The right ventricular  size is normal.   4. Left atrial size was severely dilated. No left atrial/left atrial  appendage thrombus was detected.   5. Right atrial size was mildly dilated.   6. The aortic valve is tricuspid. Aortic valve regurgitation is not  visualized. No aortic stenosis is  present.   7. Agitated saline contrast bubble study was positive with shunting  observed after >6 cardiac cycles suggestive of intrapulmonary shunting.  Assessment & Plan   1.  Mitral valve regurgitation status postrepair-progressing through cardiac rehab.  No increased DOE or activity intolerance.  Previously noted to have 3/6 systolic cardiac murmur.  Underwent TEE which showed severe MR.  Was referred to [facility] and underwent mitral valve repair. Proceed to cardiac rehab-we will send to Dr. Audie Box for prescription. Continue aspirin  Hyperlipidemia-LDL 141 on 05/19/2021 Continue aspirin Heart healthy low-sodium diet Increase physical activity as tolerated Follows with PCP  ALS-stable, mild.  Continues to work as a Scientist, water quality as per his new. Follows with PCP  Disposition: Follow-up with Dr. Audie Box as scheduled.   Jossie Ng. Bevelyn Arriola NP-C     09/15/2022, 11:42 AM [place] [address] Office [phone] Fax [phone]  Notice: This dictation was prepared with Dragon dictation along with smaller phrase technology. Any transcriptional errors that result from this process are unintentional and may not be corrected upon review.  I spent 13 minutes examining this patient, reviewing medications, and using patient centered shared decision making involving her cardiac care.  Prior to her visit I spent greater than 20 minutes reviewing her past medical history,  medications, and prior cardiac tests.

## 2022-09-15 ENCOUNTER — Ambulatory Visit: Payer: Medicare HMO | Attending: General Practice | Admitting: General Practice

## 2022-09-15 ENCOUNTER — Encounter: Payer: Self-pay | Admitting: General Practice

## 2022-09-15 VITALS — BP 128/88 | HR 90 | Ht 68.0 in | Wt 137.0 lb

## 2022-09-15 DIAGNOSIS — E782 Mixed hyperlipidemia: Secondary | ICD-10-CM | POA: Diagnosis not present

## 2022-09-15 DIAGNOSIS — I340 Nonrheumatic mitral (valve) insufficiency: Secondary | ICD-10-CM

## 2022-09-15 DIAGNOSIS — G1221 Amyotrophic lateral sclerosis: Secondary | ICD-10-CM | POA: Diagnosis not present

## 2022-09-15 NOTE — Patient Instructions (Signed)
Medication Instructions:  The current medical regimen is effective;  continue present plan and medications as directed. Please refer to the Current Medication list given to you today.  *If you need a refill on your cardiac medications before your next appointment, please call your pharmacy*  Lab Work: NONE If you have labs (blood work) drawn today and your tests are completely normal, you will receive your results only by:  [place] (if you have MyChart) OR A paper copy in the mail If you have any lab test that is abnormal or we need to change your treatment, we will call you to review the results.  Follow-Up: At [facility], you and your health needs are our priority.  As part of our continuing mission to provide you with exceptional heart care, we have created designated Provider Care Teams.  These Care Teams include your primary Cardiologist (physician) and Advanced Practice Providers (APPs -  Physician Assistants and Nurse Practitioners) who all work together to provide you with the care you need, when you need it.  Your next appointment:   KEEP SCHEDULED  APPOINTMENT  The format for your next appointment:   In Person  Provider:   Evalina Field, MD    Other Instructions PLEASE CONTINUE TO [place]

## 2022-09-15 NOTE — Addendum Note (Signed)
Addended by: Waylan Rocher on: 09/15/2022 11:58 AM   Modules accepted: Orders

## 2022-09-15 NOTE — Addendum Note (Signed)
Addended by: Waylan Rocher on: 09/15/2022 04:36 PM   Modules accepted: Orders

## 2022-09-18 ENCOUNTER — Telehealth: Payer: Self-pay | Admitting: General Practice

## 2022-09-18 ENCOUNTER — Ambulatory Visit (HOSPITAL_BASED_OUTPATIENT_CLINIC_OR_DEPARTMENT_OTHER)
Admission: RE | Admit: 2022-09-18 | Discharge: 2022-09-18 | Disposition: A | Discharge status: Home / Self Care | Payer: Medicare HMO | Source: Ambulatory Visit | Attending: Cardiovascular Disease | Admitting: Cardiovascular Disease

## 2022-09-18 DIAGNOSIS — I340 Nonrheumatic mitral (valve) insufficiency: Secondary | ICD-10-CM | POA: Diagnosis not present

## 2022-09-18 DIAGNOSIS — Z954 Presence of other heart-valve replacement: Secondary | ICD-10-CM | POA: Diagnosis not present

## 2022-09-18 NOTE — Telephone Encounter (Signed)
Patient calling to speak with Denyse Amass she says she would like to speak with him today. She states she has felt terrible since her last appointment, but did not want to give any further information.

## 2022-09-18 NOTE — Telephone Encounter (Signed)
Returned the call to the patient. She had a mitral valve repair on 8/21 with a post op visit on 9/19. She stated that she was feeling fine when she saw Denyse Amass but for the past two days she has been feeling bad. She has complaints of restlessness, fatigue, and shortness of breath. She denies chest pain and edema. She did not have any blood pressure readings.   She stated that she has felt like this before but it did not last this long. She would like to have [place] or Dr. Kathalene Frames opinion. She has not reached out to [facility] who did the procedure. She stated that they released her back to this office.  She has been advised for worsening symptoms it may be best to go to the ED for assessment.

## 2022-09-18 NOTE — Telephone Encounter (Signed)
Echo moved from 9/29 to today at [facility]. Patient has been made aware.

## 2022-09-18 NOTE — Progress Notes (Signed)
  Echocardiogram 2D Echocardiogram has been performed.  Karen Villanueva F 09/18/2022, 3:22 PM

## 2022-09-19 LAB — ECHOCARDIOGRAM COMPLETE
AR max vel: 2.35 cm2
AV Area VTI: 2.23 cm2
AV Area mean vel: 2.02 cm2
AV Mean grad: 5 mmHg
AV Peak grad: 8.5 mmHg
Ao pk vel: 1.46 m/s
Area-P 1/2: 3.91 cm2
MV VTI: 1.54 cm2
S' Lateral: 3.2 cm

## 2022-09-21 ENCOUNTER — Other Ambulatory Visit: Payer: Self-pay

## 2022-09-21 MED ORDER — FUROSEMIDE 20 MG PO TABS: 20.0000 mg | ORAL_TABLET | Freq: Every day | ORAL | 1 refills | Status: DC

## 2022-09-25 ENCOUNTER — Other Ambulatory Visit: Payer: Self-pay

## 2022-09-25 ENCOUNTER — Other Ambulatory Visit (HOSPITAL_COMMUNITY): Payer: Medicare HMO

## 2022-09-25 MED ORDER — FUROSEMIDE 20 MG PO TABS: 20.0000 mg | ORAL_TABLET | Freq: Every day | ORAL | 1 refills | Status: DC

## 2022-09-30 ENCOUNTER — Telehealth (HOSPITAL_COMMUNITY): Payer: Self-pay

## 2022-09-30 NOTE — Telephone Encounter (Signed)
Called and spoke with pt in regards to CR, pt stated she is not interested at this time due to her work schedule.   Closed referral

## 2022-10-14 NOTE — Progress Notes (Unsigned)
Cardiology Office Note:   Date:  10/15/2022  NAME:  Karen Villanueva    MRN: [id number] DOB:  10-Sep-1956   PCP:  Almedia Balls, NP  Cardiologist:  Evalina Field, MD  Electrophysiologist:  None   Referring MD: Almedia Balls, NP   Chief Complaint  Patient presents with   Follow-up   History of Present Illness:   Karen Villanueva is a 66 y.o. female with a hx of severe mitral valve regurgitation status post repair and August at [facility].  Still with shortness of breath.  Pulmonary hypertension noted on recent echo.  Stable repair.  She reports she is getting short of breath in the mornings.  Symptoms improved in the afternoon.  She is taking Lasix and has noticed improvement.  She is euvolemic.  No signs of volume overload.  She reports she does not snore.  No concerns for sleep apnea.  She is maintaining sinus rhythm.  She is off amiodarone.  We discussed that her symptoms could be related to pulm hypertension.  Unclear why she would still have this.  Her mitral valve has been repaired.  I have asked her to back off on Lasix to see if this helps.  She overall seems to be doing well.  I encouraged her to get active.  She did not do cardiac rehab.  I think this will help.  Her blood pressure is normal.  She is maintaining sinus rhythm.  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 08/17/2022 ([facility], 34 mm simulus rigid ring, gortex artificial cords x 2) -normal LHC Postop Afib  Pulmonary hypertension  -mPAP 40 mmHG 06/2022 ALS  Past Medical History: Past Medical History:  Diagnosis Date   ALS (amyotrophic lateral sclerosis) ([facility])    Celiac disease    Closed fracture of left distal radius    Left ulnar fracture    left   Osteoporosis     Past Surgical History: Past Surgical History:  Procedure Laterality Date   ABDOMINAL HYSTERECTOMY  1994   BUBBLE STUDY  07/06/2022   Procedure: BUBBLE STUDY;  Surgeon: Geralynn Rile, MD;  Location: [place];  Service: Cardiovascular;;   FEMUR  FRACTURE SURGERY Right    OPEN REDUCTION INTERNAL FIXATION (ORIF) DISTAL RADIAL FRACTURE Left 10/31/2019   Procedure: OPEN REDUCTION INTERNAL FIXATION (ORIF) LEFT DISTAL RADIAL FRACTURE,  CLOSED REDUCTION LEFT ULNA;  Surgeon: Leanora Cover, MD;  Location: [place];  Service: Orthopedics;  Laterality: Left;   RIGHT/LEFT HEART CATH AND CORONARY ANGIOGRAPHY N/A 07/06/2022   Procedure: RIGHT/LEFT HEART CATH AND CORONARY ANGIOGRAPHY;  Surgeon: Burnell Blanks, MD;  Location: [place] [facility];  Service: Cardiovascular;  Laterality: N/A;   TEE WITHOUT CARDIOVERSION N/A 07/06/2022   Procedure: TRANSESOPHAGEAL ECHOCARDIOGRAM (TEE);  Surgeon: Geralynn Rile, MD;  Location: [place];  Service: Cardiovascular;  Laterality: N/A;    Current Medications: Current Meds  Medication Sig   diphenhydramine-acetaminophen (TYLENOL PM) 25-500 MG TABS tablet Take 2 tablets by mouth at bedtime as needed (sleep).   furosemide (LASIX) 20 MG tablet Take 1 tablet (20 mg total) by mouth daily.   Tafluprost, PF, (ZIOPTAN) 0.0015 % SOLN Place 1 drop into both eyes at bedtime.     Allergies:    Wheat bran   Social History: Social History   Socioeconomic History   Marital status: Married    Spouse name: Not on file   Number of children: 0   Years of education: Not on file  Highest education level: Not on file  Occupational History   Occupation: Part time Scientist, water quality - [employer]  Tobacco Use   Smoking status: Former   Smokeless tobacco: Never  Scientific laboratory technician Use: Never used  Substance and Sexual Activity   Alcohol use: Not Currently   Drug use: Never   Sexual activity: Not on file  Other Topics Concern   Not on file  Social History Narrative   Not on file   Social Determinants of Health   Financial Resource Strain: Not on file  Food Insecurity: Not on file  Transportation Needs: Not on file  Physical Activity: Not on file  Stress: Not on file  Social Connections: Not on  file     Family History: The patient's family history includes Diabetes in her father; Heart failure in her mother.  ROS:   All other ROS reviewed and negative. Pertinent positives noted in the HPI.     EKGs/Labs/Other Studies Reviewed:   The following studies were personally reviewed by me today:  TTE 09/18/2022  1. Left ventricular ejection fraction, by estimation, is 60 to 65%. The  left ventricle has normal function. The left ventricle has no regional  wall motion abnormalities. There is mild left ventricular hypertrophy.  Left ventricular diastolic parameters  are consistent with Grade II diastolic dysfunction (pseudonormalization).   2. Right ventricular systolic function is normal. The right ventricular  size is normal. There is moderately elevated pulmonary artery systolic  pressure.   3. Good result of mitral valve repair. The mitral valve has been  repaired/replaced. Mild mitral valve regurgitation. No evidence of mitral  stenosis. The mean mitral valve gradient is 3.0 mmHg. Procedure Date:  08/17/2022.   4. Tricuspid valve regurgitation is moderate.   5. The aortic valve is normal in structure. Aortic valve regurgitation is  not visualized. Aortic valve sclerosis is present, with no evidence of  aortic valve stenosis.   6. The inferior vena cava is normal in size with greater than 50%  respiratory variability, suggesting right atrial pressure of 3 mmHg.   7. Right atrial size was moderately dilated.   8. Left atrial size was moderately dilated.   9. Mild mitral subvalvular thickening/fibrosis.   Recent Labs: 06/08/2022: BNP CANCELED; BUN 13; Creatinine, Ser 0.95; TSH 2.950 06/29/2022: Platelets 297 07/06/2022: Hemoglobin 9.9; Potassium 3.6; Sodium 145   Recent Lipid Panel    Component Value Date/Time   CHOL 202 (H) 06/13/2019 0904   TRIG 75 06/13/2019 0904   HDL 46 06/13/2019 0904   CHOLHDL 4.4 06/13/2019 0904   LDLCALC 141 (H) 06/13/2019 0904    Physical Exam:    VS:  BP 112/84 (BP Location: Left Arm, Patient Position: Sitting)   Pulse 85   Wt 137 lb (62.1 kg)   SpO2 98%   BMI 20.83 kg/m    Wt Readings from Last 3 Encounters:  10/15/22 137 lb (62.1 kg)  09/15/22 137 lb (62.1 kg)  07/06/22 150 lb (68 kg)    General: Well nourished, well developed, in no acute distress Head: Atraumatic, normal size  Eyes: PEERLA, EOMI  Neck: Supple, no JVD Endocrine: No thryomegaly Cardiac: Normal S1, S2; RRR; no murmurs, rubs, or gallops Lungs: Clear to auscultation bilaterally, no wheezing, rhonchi or rales  Abd: Soft, nontender, no hepatomegaly  Ext: No edema, pulses 2+ Musculoskeletal: No deformities, BUE and BLE strength normal and equal Skin: Warm and dry, no rashes   Neuro: Alert and oriented to person, place, time,  and situation, CNII-XII grossly intact, no focal deficits  Psych: Normal mood and affect   ASSESSMENT:   WYNETTE JERSEY is a 66 y.o. female who presents for the following: 1. Nonrheumatic mitral valve regurgitation   2. S/P MVR (mitral valve repair)   3. SBE (subacute bacterial endocarditis) prophylaxis candidate   4. Pulmonary hypertension, unspecified ([place])   5. SOB (shortness of breath)     PLAN:   1. Nonrheumatic mitral valve regurgitation 2. S/P MVR (mitral valve repair) 3. SBE (subacute bacterial endocarditis) prophylaxis candidate 4. Pulmonary hypertension, unspecified ([place]) 5. SOB (shortness of breath) -Status post mitral valve repair at [facility] in August of this year.  Recent echo shows stable repair.  No significant CAD.  She did have postop A-fib but this has gone away.  She is still short of breath in the mornings.  Improved with Lasix.  Echocardiogram shows pulmonary hypertension.  Unclear why she would still have this.  I asked her to stop taking Lasix to see if this helps.  Her symptoms could be related to deconditioning.  She describes no snoring or concerns for sleep apnea.  Unclear why she would still have pulm  hypertension.  I think for now we will just give her time to see if this improves with time from her repair.  She overall is doing well and euvolemic on exam.  She will take Lasix as needed.  -If her symptoms do not improve we will need to pursue right heart catheterization to confirm the diagnosis.  She may ultimately end up needing a work-up for pulm hypertension.  She does have ALS but from what she tells me it has remained dormant.  Unclear if this could be contributing.  She may benefit from a sleep study.  We will see how she does.  Disposition: Return in about 2 months (around 12/15/2022).  Medication Adjustments/Labs and Tests Ordered: Current medicines are reviewed at length with the patient today.  Concerns regarding medicines are outlined above.  No orders of the defined types were placed in this encounter.  No orders of the defined types were placed in this encounter.   Patient Instructions  Medication Instructions:  Hold Lasix for a few days- let us know how you do.  *If you need a refill on your cardiac medications before your next appointment, please call your pharmacy*   Follow-Up: At [facility] you and your health needs are our priority.  As part of our continuing mission to provide you with exceptional heart care, we have created designated Provider Care Teams.  These Care Teams include your primary Cardiologist (physician) and Advanced Practice Providers (APPs -  Physician Assistants and Nurse Practitioners) who all work together to provide you with the care you need, when you need it.  We recommend signing up for the patient portal called "MyChart".  Sign up information is provided on this After Visit Summary.  MyChart is used to connect with patients for Virtual Visits (Telemedicine).  Patients are able to view lab/test results, encounter notes, upcoming appointments, etc.  Non-urgent messages can be sent to your provider as well.   To learn more about what you  can do with MyChart, go to [email or web address].    Your next appointment:   Keep visit in December  The format for your next appointment:   In Person  Provider:   Evalina Field, MD         Time Spent with Patient: I have spent a total of  25 minutes with patient reviewing hospital notes, telemetry, EKGs, labs and examining the patient as well as establishing an assessment and plan that was discussed with the patient.  > 50% of time was spent in direct patient care.  Signed, Addison Naegeli. Audie Box, MD, [place]  [address], [place], [place] [address] [phone]  10/15/2022 3:51 PM

## 2022-10-15 ENCOUNTER — Encounter: Payer: Self-pay | Admitting: Cardiovascular Disease

## 2022-10-15 ENCOUNTER — Ambulatory Visit: Payer: Medicare HMO | Attending: Cardiovascular Disease | Admitting: Cardiovascular Disease

## 2022-10-15 VITALS — BP 112/84 | HR 85 | Wt 137.0 lb

## 2022-10-15 DIAGNOSIS — I340 Nonrheumatic mitral (valve) insufficiency: Secondary | ICD-10-CM | POA: Diagnosis not present

## 2022-10-15 DIAGNOSIS — Z2989 Encounter for other specified prophylactic measures: Secondary | ICD-10-CM

## 2022-10-15 DIAGNOSIS — Z9889 Other specified postprocedural states: Secondary | ICD-10-CM | POA: Diagnosis not present

## 2022-10-15 DIAGNOSIS — R0602 Shortness of breath: Secondary | ICD-10-CM

## 2022-10-15 DIAGNOSIS — I272 Pulmonary hypertension, unspecified: Secondary | ICD-10-CM

## 2022-10-15 NOTE — Patient Instructions (Signed)
Medication Instructions:  Hold Lasix for a few days- let us know how you do.  *If you need a refill on your cardiac medications before your next appointment, please call your pharmacy*   Follow-Up: At [facility], you and your health needs are our priority.  As part of our continuing mission to provide you with exceptional heart care, we have created designated Provider Care Teams.  These Care Teams include your primary Cardiologist (physician) and Advanced Practice Providers (APPs -  Physician Assistants and Nurse Practitioners) who all work together to provide you with the care you need, when you need it.  We recommend signing up for the patient portal called "MyChart".  Sign up information is provided on this After Visit Summary.  MyChart is used to connect with patients for Virtual Visits (Telemedicine).  Patients are able to view lab/test results, encounter notes, upcoming appointments, etc.  Non-urgent messages can be sent to your provider as well.   To learn more about what you can do with MyChart, go to [email or web address].    Your next appointment:   Keep visit in December  The format for your next appointment:   In Person  Provider:   Evalina Field, MD

## 2022-10-16 ENCOUNTER — Encounter: Payer: Self-pay | Admitting: Cardiovascular Disease

## 2022-10-19 DIAGNOSIS — H401111 Primary open-angle glaucoma, right eye, mild stage: Secondary | ICD-10-CM | POA: Diagnosis not present

## 2022-10-20 DIAGNOSIS — M7542 Impingement syndrome of left shoulder: Secondary | ICD-10-CM | POA: Diagnosis not present

## 2022-10-26 DIAGNOSIS — M7542 Impingement syndrome of left shoulder: Secondary | ICD-10-CM | POA: Diagnosis not present

## 2022-11-23 DIAGNOSIS — R399 Unspecified symptoms and signs involving the genitourinary system: Secondary | ICD-10-CM | POA: Diagnosis not present

## 2022-12-07 NOTE — Progress Notes (Unsigned)
Cardiology Office Note:   Date:  12/08/2022  NAME:  Karen Villanueva    MRN: [id number] DOB:  15-Aug-1956   PCP:  Almedia Balls, NP  Cardiologist:  Evalina Field, MD  Electrophysiologist:  None   Referring MD: Almedia Balls, NP   Chief Complaint  Patient presents with   Follow-up        History of Present Illness:   Karen Villanueva is a 66 y.o. female with a hx of MR s/p repair, postop Afib, pHTN who presents for follow-up.   She reports she is doing well.  No shortness of breath.  She gets a little congestion in the morning but symptoms are improving.  Her shortness of breath that she was having after cardiac surgery has improved dramatically.  She started exercising more.  She is doing more walking.  This is improving.  She is off all of her heart medications.  We did discuss that she only needs to take an aspirin.  She needs antibiotics before dental work.  She did have evidence of pulmonary hypertension on her cardiac catheterization prior to surgery.  Suspect this is related to mitral valve regurgitation.  No symptoms of sleep apnea.  We will plan to recheck this in 1 year to make sure this is resolving.  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 08/17/2022 ([facility], 34 mm simulus rigid ring, gortex artificial cords x 2) -normal LHC Postop Afib  Pulmonary hypertension  -mPAP 40 mmHG 06/2022 ALS  Past Medical History: Past Medical History:  Diagnosis Date   ALS (amyotrophic lateral sclerosis) (HCC)    Celiac disease    Closed fracture of left distal radius    Left ulnar fracture    left   Osteoporosis     Past Surgical History: Past Surgical History:  Procedure Laterality Date   ABDOMINAL HYSTERECTOMY  1994   BUBBLE STUDY  07/06/2022   Procedure: BUBBLE STUDY;  Surgeon: Geralynn Rile, MD;  Location: [facility];  Service: Cardiovascular;;   FEMUR FRACTURE SURGERY Right    OPEN REDUCTION INTERNAL FIXATION (ORIF) DISTAL RADIAL FRACTURE Left 10/31/2019   Procedure:  OPEN REDUCTION INTERNAL FIXATION (ORIF) LEFT DISTAL RADIAL FRACTURE,  CLOSED REDUCTION LEFT ULNA;  Surgeon: Leanora Cover, MD;  Location: [facility];  Service: Orthopedics;  Laterality: Left;   RIGHT/LEFT HEART CATH AND CORONARY ANGIOGRAPHY N/A 07/06/2022   Procedure: RIGHT/LEFT HEART CATH AND CORONARY ANGIOGRAPHY;  Surgeon: Burnell Blanks, MD;  Location: [facility];  Service: Cardiovascular;  Laterality: N/A;   TEE WITHOUT CARDIOVERSION N/A 07/06/2022   Procedure: TRANSESOPHAGEAL ECHOCARDIOGRAM (TEE);  Surgeon: Geralynn Rile, MD;  Location: [facility];  Service: Cardiovascular;  Laterality: N/A;    Current Medications: Current Meds  Medication Sig   aspirin EC 81 MG tablet Take 1 tablet (81 mg total) by mouth daily. Swallow whole.   diphenhydramine-acetaminophen (TYLENOL PM) 25-500 MG TABS tablet Take 2 tablets by mouth at bedtime as needed (sleep).   Tafluprost, PF, (ZIOPTAN) 0.0015 % SOLN Place 1 drop into both eyes at bedtime.     Allergies:    Wheat bran   Social History: Social History   Socioeconomic History   Marital status: Married    Spouse name: Not on file   Number of children: 0   Years of education: Not on file   Highest education level: Not on file  Occupational History   Occupation: Part time [employer]  Tobacco Use   Smoking  status: Former   Smokeless tobacco: Never  Scientific laboratory technician Use: Never used  Substance and Sexual Activity   Alcohol use: Not Currently   Drug use: Never   Sexual activity: Not on file  Other Topics Concern   Not on file  Social History Narrative   Not on file   Social Determinants of Health   Financial Resource Strain: Not on file  Food Insecurity: Not on file  Transportation Needs: Not on file  Physical Activity: Not on file  Stress: Not on file  Social Connections: Not on file     Family History: The patient's family history includes Diabetes in her father; Heart failure in her  mother.  ROS:   All other ROS reviewed and negative. Pertinent positives noted in the HPI.     EKGs/Labs/Other Studies Reviewed:   The following studies were personally reviewed by me today:  TTE 09/18/2022  1. Left ventricular ejection fraction, by estimation, is 60 to 65%. The  left ventricle has normal function. The left ventricle has no regional  wall motion abnormalities. There is mild left ventricular hypertrophy.  Left ventricular diastolic parameters  are consistent with Grade II diastolic dysfunction (pseudonormalization).   2. Right ventricular systolic function is normal. The right ventricular  size is normal. There is moderately elevated pulmonary artery systolic  pressure.   3. Good result of mitral valve repair. The mitral valve has been  repaired/replaced. Mild mitral valve regurgitation. No evidence of mitral  stenosis. The mean mitral valve gradient is 3.0 mmHg. Procedure Date:  08/17/2022.   4. Tricuspid valve regurgitation is moderate.   5. The aortic valve is normal in structure. Aortic valve regurgitation is  not visualized. Aortic valve sclerosis is present, with no evidence of  aortic valve stenosis.   6. The inferior vena cava is normal in size with greater than 50%  respiratory variability, suggesting right atrial pressure of 3 mmHg.   7. Right atrial size was moderately dilated.   8. Left atrial size was moderately dilated.   9. Mild mitral subvalvular thickening/fibrosis.   Recent Labs: 06/08/2022: BNP CANCELED; BUN 13; Creatinine, Ser 0.95; TSH 2.950 06/29/2022: Platelets 297 07/06/2022: Hemoglobin 9.9; Potassium 3.6; Sodium 145   Recent Lipid Panel    Component Value Date/Time   CHOL 202 (H) 06/13/2019 0904   TRIG 75 06/13/2019 0904   HDL 46 06/13/2019 0904   CHOLHDL 4.4 06/13/2019 0904   LDLCALC 141 (H) 06/13/2019 0904    Physical Exam:   VS:  BP 132/86   Pulse 79   Ht 5' 8"  (1.727 m)   Wt 140 lb 3.2 oz (63.6 kg)   SpO2 93%   BMI 21.32 kg/m     Wt Readings from Last 3 Encounters:  12/08/22 140 lb 3.2 oz (63.6 kg)  10/15/22 137 lb (62.1 kg)  09/15/22 137 lb (62.1 kg)    General: Well nourished, well developed, in no acute distress Head: Atraumatic, normal size  Eyes: PEERLA, EOMI  Neck: Supple, no JVD Endocrine: No thryomegaly Cardiac: Normal S1, S2; RRR; no murmurs, rubs, or gallops Lungs: Clear to auscultation bilaterally, no wheezing, rhonchi or rales  Abd: Soft, nontender, no hepatomegaly  Ext: No edema, pulses 2+ Musculoskeletal: No deformities, BUE and BLE strength normal and equal Skin: Warm and dry, no rashes   Neuro: Alert and oriented to person, place, time, and situation, CNII-XII grossly intact, no focal deficits  Psych: Normal mood and affect   ASSESSMENT:  ALASKA FLETT is a 66 y.o. female who presents for the following: 1. Nonrheumatic mitral valve regurgitation   2. S/P MVR (mitral valve repair)   3. SBE (subacute bacterial endocarditis) prophylaxis candidate   4. Pulmonary hypertension, unspecified ([place])   5. SOB (shortness of breath)     PLAN:   1. Nonrheumatic mitral valve regurgitation 2. S/P MVR (mitral valve repair) 3. SBE (subacute bacterial endocarditis) prophylaxis candidate -Status post mitral valve repair.  Doing well.  Stable gradients.  No significant MR on repeat echo after surgery.  She should be on aspirin 81 mg daily.  She understands she needs SBE prophylaxis.  She will call for antibiotics.  4. Pulmonary hypertension, unspecified (HCC) -Mild to moderate pulmonary hypertension at the time of heart cath.  Suspect this is related to mitral valve disease.  Plan to recheck an echo in 1 year to make sure this is resolving.  5. SOB (shortness of breath) -Symptoms are improving.  Not on Lasix.  Continue with exercise.  Suspect this was deconditioning after surgery.  Disposition: Return in about 1 year (around 12/09/2023).  Medication Adjustments/Labs and Tests Ordered: Current  medicines are reviewed at length with the patient today.  Concerns regarding medicines are outlined above.  Orders Placed This Encounter  Procedures   ECHOCARDIOGRAM COMPLETE   Meds ordered this encounter  Medications   aspirin EC 81 MG tablet    Sig: Take 1 tablet (81 mg total) by mouth daily. Swallow whole.    Dispense:  90 tablet    Refill:  3    Patient Instructions  Medication Instructions:  START Aspirin 81 mg daily   *If you need a refill on your cardiac medications before your next appointment, please call your pharmacy*   Testing/Procedures: Echocardiogram (12 months) - Your physician has requested that you have an echocardiogram. Echocardiography is a painless test that uses sound waves to create images of your heart. It provides your doctor with information about the size and shape of your heart and how well your heart's chambers and valves are working. This procedure takes approximately one hour. There are no restrictions for this procedure.     Follow-Up: At [facility], you and your health needs are our priority.  As part of our continuing mission to provide you with exceptional heart care, we have created designated Provider Care Teams.  These Care Teams include your primary Cardiologist (physician) and Advanced Practice Providers (APPs -  Physician Assistants and Nurse Practitioners) who all work together to provide you with the care you need, when you need it.  We recommend signing up for the patient portal called "MyChart".  Sign up information is provided on this After Visit Summary.  MyChart is used to connect with patients for Virtual Visits (Telemedicine).  Patients are able to view lab/test results, encounter notes, upcoming appointments, etc.  Non-urgent messages can be sent to your provider as well.   To learn more about what you can do with MyChart, go to [email or web address].    Your next appointment:   12 month(s)  The format for your next  appointment:   In Person  Provider:   Evalina Field, MD           Time Spent with Patient: I have spent a total of 25 minutes with patient reviewing hospital notes, telemetry, EKGs, labs and examining the patient as well as establishing an assessment and plan that was discussed with the patient.  > 50% of time  was spent in direct patient care.  Signed, Addison Naegeli. Audie Box, MD, [place]  [address] [place], [place] [address] [phone]  12/08/2022 9:37 AM

## 2022-12-08 ENCOUNTER — Encounter: Payer: Self-pay | Admitting: Cardiovascular Disease

## 2022-12-08 ENCOUNTER — Ambulatory Visit: Payer: Medicare HMO | Attending: Cardiovascular Disease | Admitting: Cardiovascular Disease

## 2022-12-08 VITALS — BP 132/86 | HR 79 | Ht 68.0 in | Wt 140.2 lb

## 2022-12-08 DIAGNOSIS — Z2989 Encounter for other specified prophylactic measures: Secondary | ICD-10-CM | POA: Diagnosis not present

## 2022-12-08 DIAGNOSIS — I272 Pulmonary hypertension, unspecified: Secondary | ICD-10-CM | POA: Diagnosis not present

## 2022-12-08 DIAGNOSIS — I340 Nonrheumatic mitral (valve) insufficiency: Secondary | ICD-10-CM | POA: Diagnosis not present

## 2022-12-08 DIAGNOSIS — Z9889 Other specified postprocedural states: Secondary | ICD-10-CM

## 2022-12-08 DIAGNOSIS — R0602 Shortness of breath: Secondary | ICD-10-CM

## 2022-12-08 MED ORDER — ASPIRIN 81 MG PO TBEC: 81.0000 mg | DELAYED_RELEASE_TABLET | Freq: Every day | ORAL | 3 refills | Status: AC

## 2022-12-08 NOTE — Patient Instructions (Signed)
Medication Instructions:  START Aspirin 81 mg daily   *If you need a refill on your cardiac medications before your next appointment, please call your pharmacy*   Testing/Procedures: Echocardiogram (12 months) - Your physician has requested that you have an echocardiogram. Echocardiography is a painless test that uses sound waves to create images of your heart. It provides your doctor with information about the size and shape of your heart and how well your heart's chambers and valves are working. This procedure takes approximately one hour. There are no restrictions for this procedure.     Follow-Up: At [facility], you and your health needs are our priority.  As part of our continuing mission to provide you with exceptional heart care, we have created designated Provider Care Teams.  These Care Teams include your primary Cardiologist (physician) and Advanced Practice Providers (APPs -  Physician Assistants and Nurse Practitioners) who all work together to provide you with the care you need, when you need it.  We recommend signing up for the patient portal called "MyChart".  Sign up information is provided on this After Visit Summary.  MyChart is used to connect with patients for Virtual Visits (Telemedicine).  Patients are able to view lab/test results, encounter notes, upcoming appointments, etc.  Non-urgent messages can be sent to your provider as well.   To learn more about what you can do with MyChart, go to [email or web address].    Your next appointment:   12 month(s)  The format for your next appointment:   In Person  Provider:   Evalina Field, MD

## 2023-01-13 ENCOUNTER — Telehealth: Payer: Self-pay | Admitting: Cardiovascular Disease

## 2023-01-13 NOTE — Telephone Encounter (Signed)
I did not need this encounter. °

## 2023-01-21 DIAGNOSIS — H401131 Primary open-angle glaucoma, bilateral, mild stage: Secondary | ICD-10-CM | POA: Diagnosis not present

## 2023-02-18 DIAGNOSIS — R399 Unspecified symptoms and signs involving the genitourinary system: Secondary | ICD-10-CM | POA: Diagnosis not present

## 2023-02-18 DIAGNOSIS — N390 Urinary tract infection, site not specified: Secondary | ICD-10-CM | POA: Diagnosis not present

## 2023-02-18 DIAGNOSIS — R030 Elevated blood-pressure reading, without diagnosis of hypertension: Secondary | ICD-10-CM | POA: Diagnosis not present

## 2023-03-15 DIAGNOSIS — G1221 Amyotrophic lateral sclerosis: Secondary | ICD-10-CM | POA: Diagnosis not present

## 2023-03-15 DIAGNOSIS — R399 Unspecified symptoms and signs involving the genitourinary system: Secondary | ICD-10-CM | POA: Diagnosis not present

## 2023-03-15 DIAGNOSIS — R300 Dysuria: Secondary | ICD-10-CM | POA: Diagnosis not present

## 2023-03-15 DIAGNOSIS — N390 Urinary tract infection, site not specified: Secondary | ICD-10-CM | POA: Diagnosis not present

## 2023-03-25 DIAGNOSIS — Z87440 Personal history of urinary (tract) infections: Secondary | ICD-10-CM | POA: Diagnosis not present

## 2023-03-25 DIAGNOSIS — R399 Unspecified symptoms and signs involving the genitourinary system: Secondary | ICD-10-CM | POA: Diagnosis not present

## 2023-04-19 DIAGNOSIS — H52209 Unspecified astigmatism, unspecified eye: Secondary | ICD-10-CM | POA: Diagnosis not present

## 2023-04-19 DIAGNOSIS — H524 Presbyopia: Secondary | ICD-10-CM | POA: Diagnosis not present

## 2023-04-19 DIAGNOSIS — H401131 Primary open-angle glaucoma, bilateral, mild stage: Secondary | ICD-10-CM | POA: Diagnosis not present

## 2023-04-19 DIAGNOSIS — H5213 Myopia, bilateral: Secondary | ICD-10-CM | POA: Diagnosis not present

## 2023-05-31 ENCOUNTER — Ambulatory Visit
Admission: RE | Admit: 2023-05-31 | Discharge: 2023-05-31 | Disposition: A | Discharge status: Home / Self Care | Payer: Medicare HMO | Source: Ambulatory Visit | Attending: Family Medicine | Admitting: Family Medicine

## 2023-05-31 ENCOUNTER — Other Ambulatory Visit: Payer: Self-pay | Admitting: Family Medicine

## 2023-05-31 DIAGNOSIS — M25551 Pain in right hip: Secondary | ICD-10-CM

## 2023-05-31 DIAGNOSIS — Z0000 Encounter for general adult medical examination without abnormal findings: Secondary | ICD-10-CM | POA: Diagnosis not present

## 2023-05-31 DIAGNOSIS — M810 Age-related osteoporosis without current pathological fracture: Secondary | ICD-10-CM | POA: Diagnosis not present

## 2023-05-31 DIAGNOSIS — G1221 Amyotrophic lateral sclerosis: Secondary | ICD-10-CM | POA: Diagnosis not present

## 2023-05-31 DIAGNOSIS — Z9889 Other specified postprocedural states: Secondary | ICD-10-CM | POA: Diagnosis not present

## 2023-05-31 DIAGNOSIS — Z1231 Encounter for screening mammogram for malignant neoplasm of breast: Secondary | ICD-10-CM | POA: Diagnosis not present

## 2023-05-31 DIAGNOSIS — E785 Hyperlipidemia, unspecified: Secondary | ICD-10-CM | POA: Diagnosis not present

## 2023-05-31 DIAGNOSIS — I272 Pulmonary hypertension, unspecified: Secondary | ICD-10-CM | POA: Diagnosis not present

## 2023-06-10 DIAGNOSIS — M810 Age-related osteoporosis without current pathological fracture: Secondary | ICD-10-CM | POA: Diagnosis not present

## 2023-06-10 DIAGNOSIS — Z13820 Encounter for screening for osteoporosis: Secondary | ICD-10-CM | POA: Diagnosis not present

## 2023-06-10 DIAGNOSIS — M25551 Pain in right hip: Secondary | ICD-10-CM | POA: Diagnosis not present

## 2023-06-10 DIAGNOSIS — Z1231 Encounter for screening mammogram for malignant neoplasm of breast: Secondary | ICD-10-CM | POA: Diagnosis not present

## 2023-07-27 DIAGNOSIS — H401111 Primary open-angle glaucoma, right eye, mild stage: Secondary | ICD-10-CM | POA: Diagnosis not present

## 2023-08-23 IMAGING — DX DG WRIST COMPLETE 3+V*R*
4 series · 4 of 4 positions shown · non-contrast
Comparison: Right wrist radiographs 09/08/2019

CLINICAL DATA: Fall, wrist pain

EXAM:
RIGHT WRIST - COMPLETE 3+ VIEW

[wrist ap (1 of 2)]
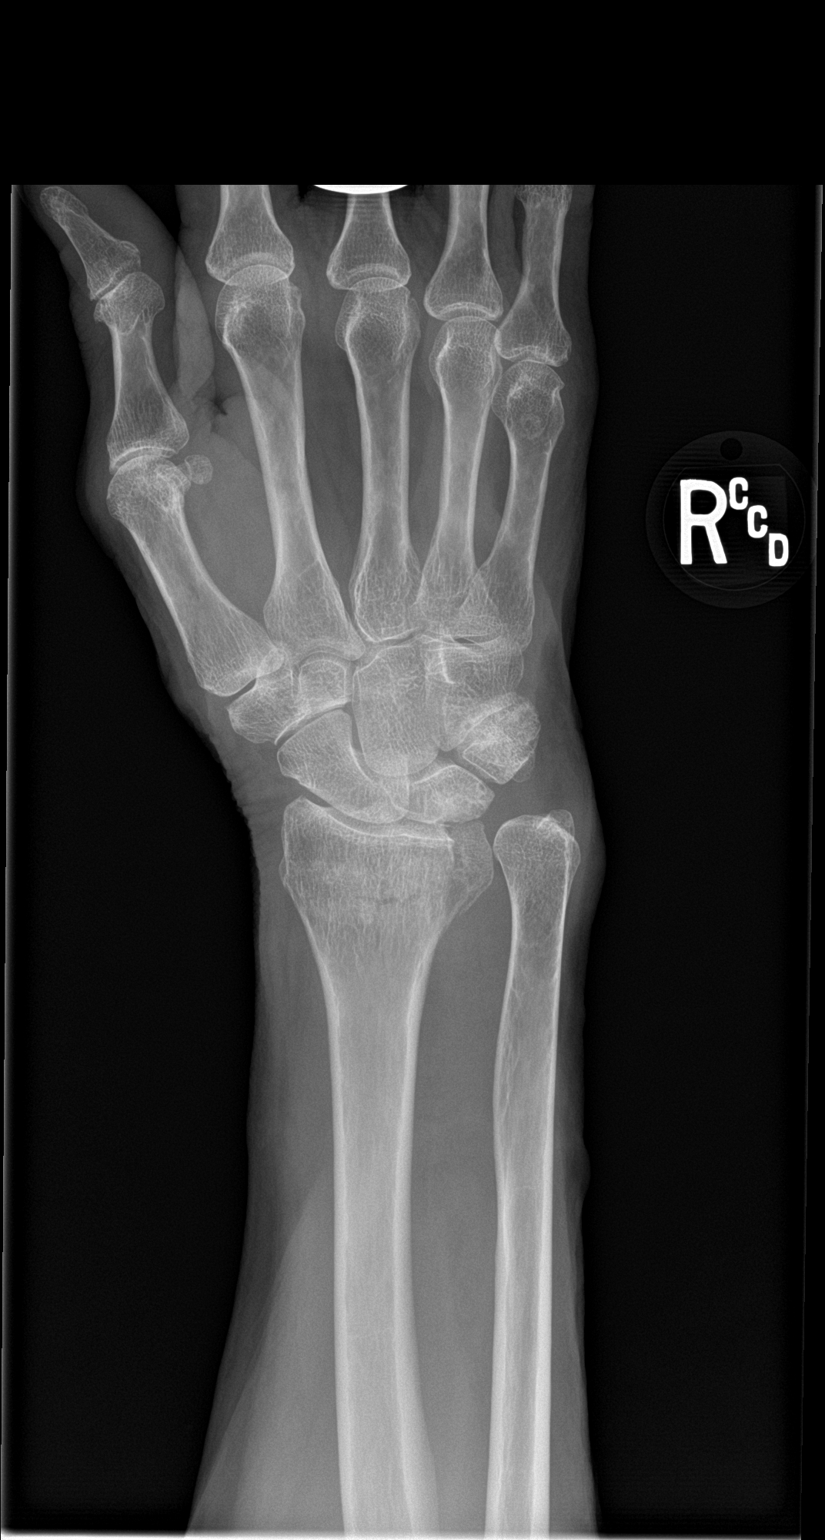

[wrist obl]
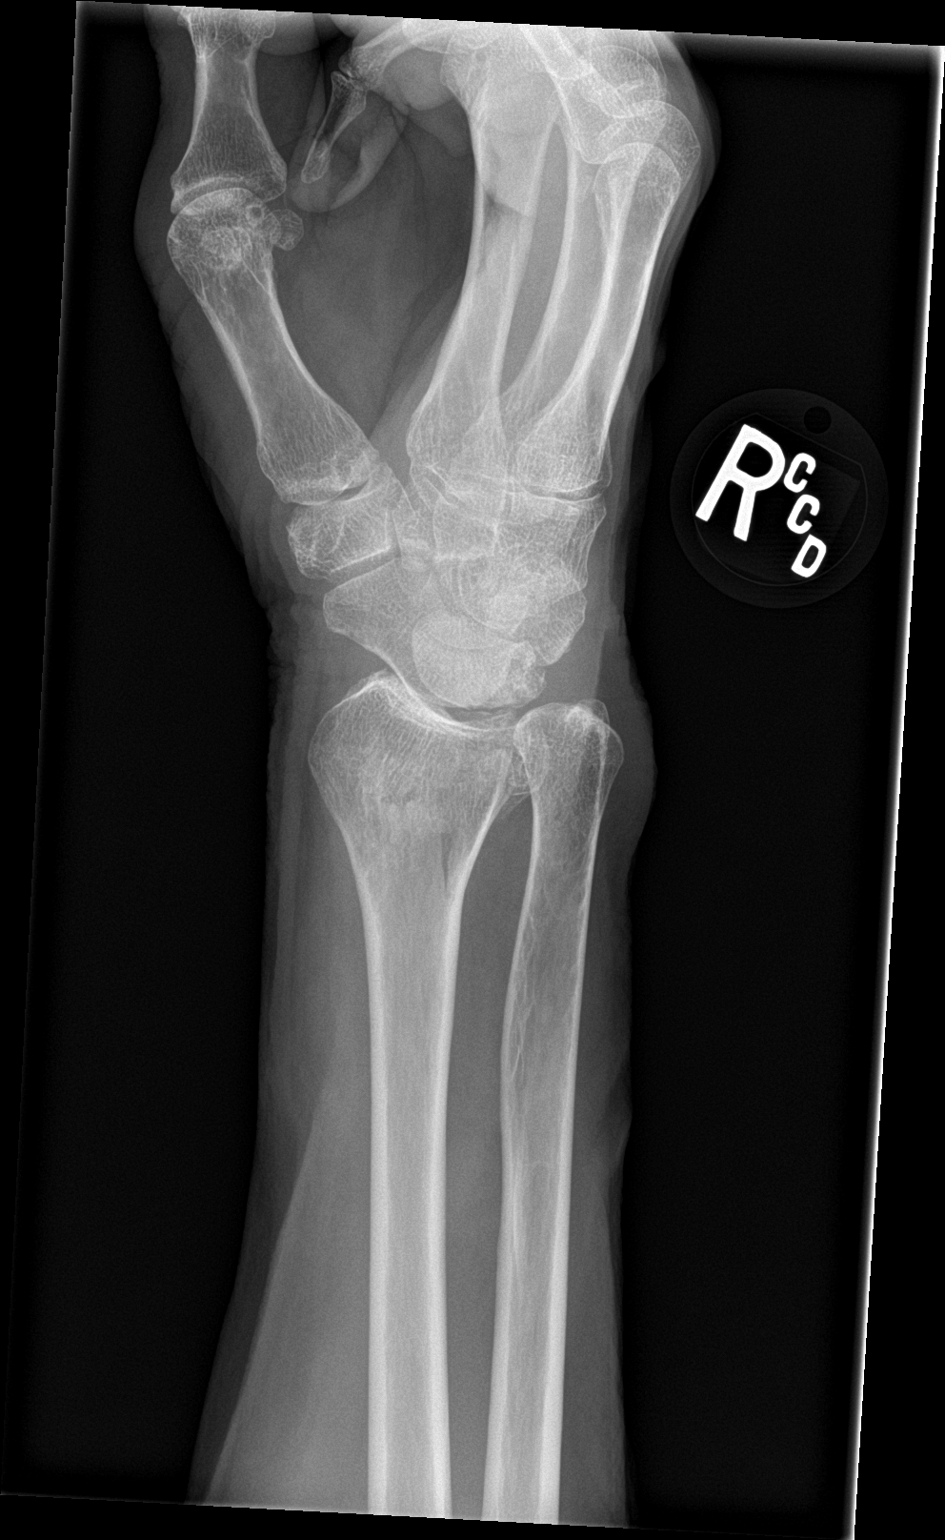

[wrist lat]
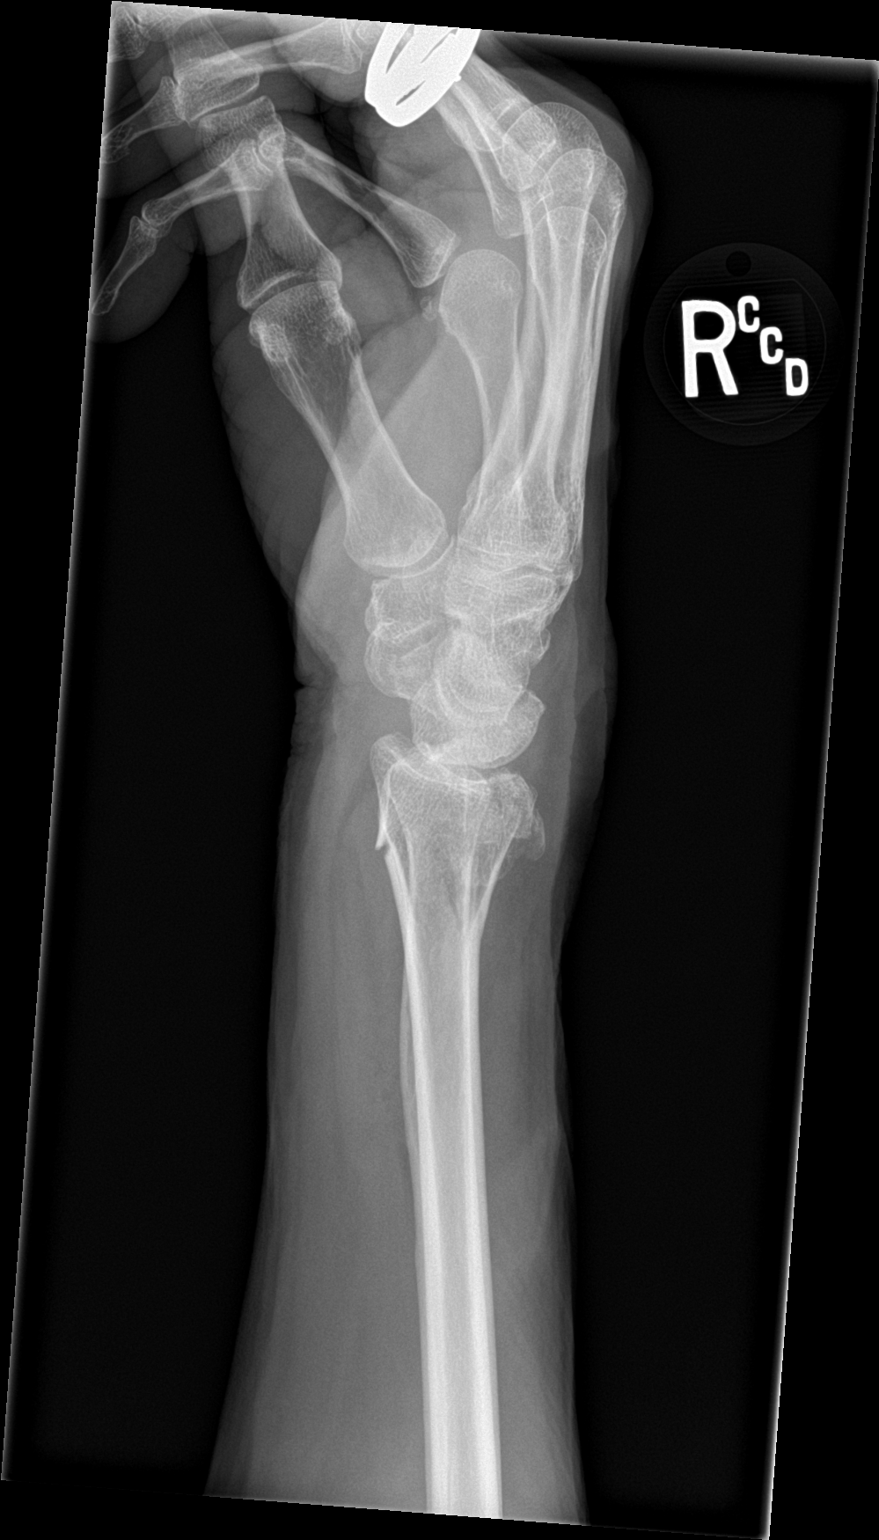

[wrist ap (2 of 2)]
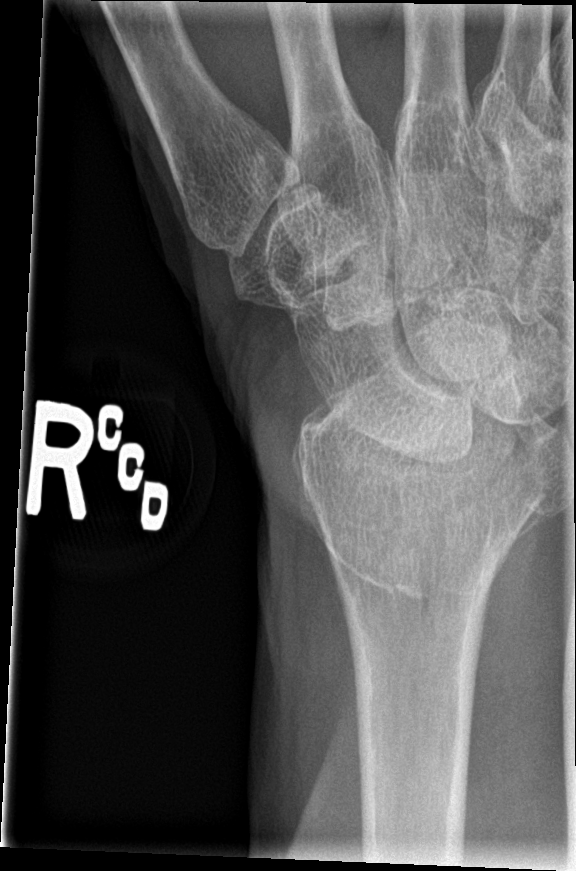

[4 of 4 positions shown; findings below may reference images not displayed]

FINDINGS: There is an impacted minimally dorsally angulated fracture of the
distal radius with suspected intra-articular extension. Radiocarpal
alignment is maintained. There is surrounding soft tissue swelling.
No other fracture is seen.
IMPRESSION: Impacted minimally dorsally angulated fracture of the distal radius
with suspected intra-articular extension.

## 2023-09-06 ENCOUNTER — Encounter: Payer: Self-pay | Admitting: Cardiovascular Disease

## 2023-09-17 ENCOUNTER — Telehealth: Payer: Self-pay | Admitting: Cardiovascular Disease

## 2023-09-17 NOTE — Telephone Encounter (Signed)
Pre-operative Risk Assessment    Patient Name: Karen Villanueva  DOB: 1956-08-04 MRN: [id number]      Request for Surgical Clearance    Procedure:   Routine dental cleaning  Date of Surgery:  Clearance 10/14/23                                 Surgeon:  Dr. Bradd Canary Surgeon's Group or Practice Name:  [facility]  Phone number:  [phone]  Fax number:  [phone]   Type of Clearance Requested:   - Medical    Type of Anesthesia:  None    Additional requests/questions:   Caller Aram Beecham) wants to know if patient will need any pre-medication prior to any dental treatment.  Caller stated they will need a letter to confirm medical clearance.  Signed, Annetta Maw   09/17/2023, 12:42 PM

## 2023-09-20 MED ORDER — AMOXICILLIN 500 MG PO TABS: ORAL_TABLET | ORAL | 3 refills | Status: DC

## 2023-09-20 NOTE — Telephone Encounter (Signed)
Patient Name: Karen Villanueva  DOB: Jul 02, 1956 MRN: [id number]  Primary Cardiologist: Reatha Harps, MD  Chart reviewed as part of pre-operative protocol coverage.   Dental cleanings are considered low risk procedures per guidelines and generally do not require any specific cardiac clearance. It is also generally accepted that for dental cleanings, there is no need to interrupt blood thinner therapy.  SBE prophylaxis is required for the patient from a cardiac standpoint for any dental work. I will route to our callback team to please call patient to verify allergies and if not allergic to amoxicillin, please send in amoxicillin 500mg  take 4 tablets by mouth 30-60 minutes prior to any dental work, dispense #4 with 2 refills.  I will also route this recommendation to the requesting party via Epic fax function and remove from pre-op pool.  Please call with questions.  Laurann [place], PA-C 09/20/2023, 9:32 AM

## 2023-09-20 NOTE — Telephone Encounter (Signed)
Call placed to pt regarding surgical clearance and the need for SBE Prophylaxis, Amoxicillin 500 mg taking 4 tablets 30-60 mins prior to dental work.  Left pt  a message to call back.  Will route clearance back over to the requesting office.  Amoxicillin has been sent to pt's Pharmacy on file [facility])

## 2023-09-21 NOTE — Telephone Encounter (Signed)
Returned pt's call.  Left a detailed message re: requiring SBE prophylaxis and to take Amoxicillin 500 mg taking 4 tabets 30-60 mins prior to any dental work.  If she had any questions, to call the office and ask for the preop team.

## 2023-09-21 NOTE — Telephone Encounter (Signed)
Patient is returning phone call requesting to speak with Pre-Op.

## 2023-09-23 ENCOUNTER — Other Ambulatory Visit: Payer: Self-pay

## 2023-09-23 MED ORDER — AMOXICILLIN 500 MG PO TABS: ORAL_TABLET | ORAL | 3 refills | Status: AC

## 2023-09-23 NOTE — Telephone Encounter (Signed)
Pt's medication was resent to pt's preferred pharmacy. Confirmation received.

## 2023-10-14 ENCOUNTER — Encounter (HOSPITAL_COMMUNITY): Payer: Self-pay | Admitting: Cardiovascular Disease

## 2023-10-19 ENCOUNTER — Telehealth (HOSPITAL_COMMUNITY): Payer: Self-pay | Admitting: Cardiovascular Disease

## 2023-10-19 NOTE — Telephone Encounter (Signed)
Just an FYI. We have made several attempts to contact this patient including sending a letter to schedule or reschedule their echocardiogram. We will be removing the patient from the echo WQ.   10/14/23 MYCHART LETTER SENT [facility] 10/12/23 [facility] to schedule x 3 @ 9:35/LBW 10/05/23 [facility] to schedule x 2 for Nov @ 10:33/LBW 09/28/23 [facility] to schedule for Nov/[facility] 9:33am    Thank you

## 2023-10-20 DIAGNOSIS — H401123 Primary open-angle glaucoma, left eye, severe stage: Secondary | ICD-10-CM | POA: Diagnosis not present

## 2023-10-27 NOTE — Telephone Encounter (Signed)
Made contact with pt today. Routed her to [employer] [facility]. Echo scheduled

## 2023-11-22 ENCOUNTER — Ambulatory Visit (HOSPITAL_COMMUNITY): Payer: Medicare HMO | Attending: Cardiovascular Disease

## 2023-11-22 DIAGNOSIS — Z9889 Other specified postprocedural states: Secondary | ICD-10-CM | POA: Diagnosis not present

## 2023-11-22 DIAGNOSIS — Z952 Presence of prosthetic heart valve: Secondary | ICD-10-CM | POA: Diagnosis not present

## 2023-11-22 DIAGNOSIS — Z48812 Encounter for surgical aftercare following surgery on the circulatory system: Secondary | ICD-10-CM | POA: Insufficient documentation

## 2023-11-22 LAB — ECHOCARDIOGRAM COMPLETE
Area-P 1/2: 1.93 cm2
Calc EF: 48.6 %
Est EF: 55
MV VTI: 1.63 cm2
S' Lateral: 2.74 cm
Single Plane A2C EF: 50 %
Single Plane A4C EF: 46.9 %

## 2023-11-23 DIAGNOSIS — H18593 Other hereditary corneal dystrophies, bilateral: Secondary | ICD-10-CM | POA: Diagnosis not present

## 2023-11-30 DIAGNOSIS — S39012A Strain of muscle, fascia and tendon of lower back, initial encounter: Secondary | ICD-10-CM | POA: Diagnosis not present

## 2023-12-03 DIAGNOSIS — M545 Low back pain, unspecified: Secondary | ICD-10-CM | POA: Diagnosis not present

## 2023-12-03 DIAGNOSIS — Z6822 Body mass index (BMI) 22.0-22.9, adult: Secondary | ICD-10-CM | POA: Diagnosis not present

## 2023-12-07 DIAGNOSIS — M545 Low back pain, unspecified: Secondary | ICD-10-CM | POA: Diagnosis not present

## 2023-12-21 DIAGNOSIS — M545 Low back pain, unspecified: Secondary | ICD-10-CM | POA: Diagnosis not present

## 2023-12-26 DIAGNOSIS — M545 Low back pain, unspecified: Secondary | ICD-10-CM | POA: Diagnosis not present

## 2023-12-28 DIAGNOSIS — M8448XA Pathological fracture, other site, initial encounter for fracture: Secondary | ICD-10-CM | POA: Diagnosis not present

## 2023-12-28 DIAGNOSIS — S22080A Wedge compression fracture of T11-T12 vertebra, initial encounter for closed fracture: Secondary | ICD-10-CM | POA: Diagnosis not present

## 2023-12-31 ENCOUNTER — Other Ambulatory Visit (HOSPITAL_COMMUNITY): Payer: Self-pay | Admitting: Orthopedic Surgery

## 2023-12-31 DIAGNOSIS — M545 Low back pain, unspecified: Secondary | ICD-10-CM | POA: Diagnosis not present

## 2023-12-31 DIAGNOSIS — M8448XA Pathological fracture, other site, initial encounter for fracture: Secondary | ICD-10-CM

## 2024-01-06 DIAGNOSIS — R399 Unspecified symptoms and signs involving the genitourinary system: Secondary | ICD-10-CM | POA: Diagnosis not present

## 2024-01-07 NOTE — Progress Notes (Signed)
 RE: CT Guided Needle placement Received: Today Karalee Wilkie POUR, MD  Vamsi Apfel Denied - not enough bone left to biopsy, severe vertebra plana. Unable to reach ordering provider, office is CLOSED.  Let them know Monday bx isn't possible, if they want to discuss, have them call me.  HKM       Previous Messages    ----- Message ----- From: Doxie Augenstein Sent: 01/07/2024   9:03 AM EST To: Adore Kithcart; Ir Procedure Requests Subject: CT Guided Needle placement                    Procedure: CT Guided Needle placement  Reason : pathological fracture, other side, initial encounter fracture Dx: Pathological fracture, other site, initial encounter for fracture [F15.51KJ (ICD-10-CM)]  Ordering Comments  Biopsy T12 pathological fracture/mri done / ct done eval for mass.    History : MRI L spine ( in PACS) , and Xray L- spine  Provider : Heide Ingle, MD  Provider contact:  [phone]

## 2024-01-13 DIAGNOSIS — M8448XA Pathological fracture, other site, initial encounter for fracture: Secondary | ICD-10-CM | POA: Diagnosis not present

## 2024-01-17 DIAGNOSIS — M8448XA Pathological fracture, other site, initial encounter for fracture: Secondary | ICD-10-CM | POA: Diagnosis not present

## 2024-01-19 ENCOUNTER — Encounter: Payer: Self-pay | Admitting: Medical Oncology

## 2024-01-19 ENCOUNTER — Other Ambulatory Visit: Payer: Medicare HMO

## 2024-01-19 NOTE — Progress Notes (Signed)
  [facility]  Diagnostic Nurse Navigator Telephone Note:  Initial Encounter  01/19/24  Patient Name:  Karen Villanueva Patient MRN:  [id number] Patient DOB:  Oct 23, 1956   I contacted Karen Villanueva regarding their hematology/oncology referral from Dr. Audry Riles for purpose of evaluation and work up of T12 fractures.  I introduced myself by phone and confirmed the patient's identity using two identifiers; introduced myself, my role, and reason for contact.  Karen Villanueva was not aware of her referral and reason.  Information on reason for referral was necessary.  The [facility]) was explained to the patient, including the intent being to complete a diagnostic/prognostic plan of care for the patient's findings concerning for cancer after a face-to-face visit and physical assessment in clinic by the [facility] APP and/or the supervising physician.  Pertinent questions were addressed to the patient's satisfaction at this time.    The patient is aware of [facility] appointment details and of the intent of the visit(s).  Care coordination during this diagnostic process will be performed by the [facility] Diagnostic Navigator.  We look forward to meeting Karen Villanueva to complete her diagnostic work-up and coordination of subsequent care post-diagnosis as appropriate.  Rapid [facility] Clinic Care Team Info:  Best contact/way to contact patient:  home phone CHL updated to reflect?:  not applicable Is there a HC POA?:  not reviewed [facility] Transportation Screen completed:  No Electronic Welcome Letter reviewed and sent:  Yes  [facility] Appointment Details Provided at Time of Call: Appt Provider:  Karie Fetch.  PA-C Appt Date:  01/24/2024 Appt Time:  12:30 PM Comment:   Referral Investigation: Imaging Supporting Referral: Images are unavailable. Per referral notes: Scanned report under media tab.  CT /Chest with Contrast  12/28/2023  Impression:  1. T12 compression fracture with retropulsed fragment causing canal stenosis.  2. No suspicious pulmonary nodules or mediastinal adenopathy.   MRI images not available. Completed 12/26/2023   Diagnostic Navigator:  Marisue Brooklyn, RN                                     [facility]

## 2024-01-20 ENCOUNTER — Inpatient Hospital Stay
Admission: RE | Admit: 2024-01-20 | Discharge: 2024-01-20 | Disposition: A | Discharge status: Home / Self Care | Payer: Self-pay | Source: Ambulatory Visit | Attending: Physician Assistant | Admitting: Physician Assistant

## 2024-01-20 ENCOUNTER — Other Ambulatory Visit: Payer: Self-pay

## 2024-01-20 ENCOUNTER — Encounter: Payer: Self-pay | Admitting: Medical Oncology

## 2024-01-20 DIAGNOSIS — M8080XG Other osteoporosis with current pathological fracture, unspecified site, subsequent encounter for fracture with delayed healing: Secondary | ICD-10-CM

## 2024-01-20 DIAGNOSIS — H401123 Primary open-angle glaucoma, left eye, severe stage: Secondary | ICD-10-CM | POA: Diagnosis not present

## 2024-01-24 ENCOUNTER — Encounter: Payer: Self-pay | Admitting: Medical Oncology

## 2024-01-24 ENCOUNTER — Other Ambulatory Visit: Payer: Self-pay

## 2024-01-24 ENCOUNTER — Inpatient Hospital Stay: Payer: Medicare HMO | Attending: Physician Assistant | Admitting: Physician Assistant

## 2024-01-24 ENCOUNTER — Inpatient Hospital Stay: Payer: Medicare HMO

## 2024-01-24 VITALS — BP 157/101 | HR 89 | Temp 98.1°F | Resp 17 | Wt 146.1 lb

## 2024-01-24 DIAGNOSIS — M8080XG Other osteoporosis with current pathological fracture, unspecified site, subsequent encounter for fracture with delayed healing: Secondary | ICD-10-CM | POA: Diagnosis not present

## 2024-01-24 DIAGNOSIS — M549 Dorsalgia, unspecified: Secondary | ICD-10-CM | POA: Diagnosis not present

## 2024-01-24 LAB — URINALYSIS, COMPLETE (UACMP) WITH MICROSCOPIC
Bacteria, UA: NONE SEEN
Bilirubin Urine: NEGATIVE
Glucose, UA: NEGATIVE mg/dL
Hgb urine dipstick: NEGATIVE
Ketones, ur: NEGATIVE mg/dL
Nitrite: NEGATIVE
Protein, ur: NEGATIVE mg/dL
Specific Gravity, Urine: 1.02 (ref 1.005–1.030)
pH: 5 (ref 5.0–8.0)

## 2024-01-24 LAB — CBC WITH DIFFERENTIAL (CANCER CENTER ONLY)
Abs Immature Granulocytes: 0.03 10*3/uL (ref 0.00–0.07)
Basophils Absolute: 0.1 10*3/uL (ref 0.0–0.1)
Basophils Relative: 1 %
Eosinophils Absolute: 0.2 10*3/uL (ref 0.0–0.5)
Eosinophils Relative: 2 %
HCT: 36.6 % (ref 36.0–46.0)
Hemoglobin: 11.7 g/dL — ABNORMAL LOW (ref 12.0–15.0)
Immature Granulocytes: 0 %
Lymphocytes Relative: 22 %
Lymphs Abs: 1.7 10*3/uL (ref 0.7–4.0)
MCH: 24 pg — ABNORMAL LOW (ref 26.0–34.0)
MCHC: 32 g/dL (ref 30.0–36.0)
MCV: 75 fL — ABNORMAL LOW (ref 80.0–100.0)
Monocytes Absolute: 0.5 10*3/uL (ref 0.1–1.0)
Monocytes Relative: 7 %
Neutro Abs: 5.1 10*3/uL (ref 1.7–7.7)
Neutrophils Relative %: 68 %
Platelet Count: 430 10*3/uL — ABNORMAL HIGH (ref 150–400)
RBC: 4.88 MIL/uL (ref 3.87–5.11)
RDW: 19.1 % — ABNORMAL HIGH (ref 11.5–15.5)
WBC Count: 7.5 10*3/uL (ref 4.0–10.5)
nRBC: 0 % (ref 0.0–0.2)

## 2024-01-24 LAB — CMP (CANCER CENTER ONLY)
ALT: 10 U/L (ref 0–44)
AST: 14 U/L — ABNORMAL LOW (ref 15–41)
Albumin: 4.5 g/dL (ref 3.5–5.0)
Alkaline Phosphatase: 95 U/L (ref 38–126)
Anion gap: 7 (ref 5–15)
BUN: 18 mg/dL (ref 8–23)
CO2: 25 mmol/L (ref 22–32)
Calcium: 10.2 mg/dL (ref 8.9–10.3)
Chloride: 106 mmol/L (ref 98–111)
Creatinine: 1.01 mg/dL — ABNORMAL HIGH (ref 0.44–1.00)
GFR, Estimated: >60 mL/min (ref >60)
Glucose, Bld: 99 mg/dL (ref 70–99)
Potassium: 3.9 mmol/L (ref 3.5–5.1)
Sodium: 138 mmol/L (ref 135–145)
Total Bilirubin: 0.4 mg/dL (ref 0.0–1.2)
Total Protein: 7.8 g/dL (ref 6.5–8.1)

## 2024-01-24 LAB — LACTATE DEHYDROGENASE: LDH: 133 U/L (ref 98–192)

## 2024-01-24 NOTE — Patient Instructions (Addendum)
Diagnostic Clinic Office Visit Discharge Information and Instructions  Thank you for choosing [facility] for your healthcare needs.  Below is a summary of today's discussion, along with our contact information and an outline of what to expect next.  Reason for Visit:  T12 fracture  Proposed Diagnostic Care Plan: Labs collected today PET scan has been ordered. I will work on getting prior authorization from your insurance and once the scan is approved we will call to let you know. Once it is approved you please call the Central Scheduling Department to schedule your scan. The phone number is [phone]. I will call you once I have the scan results.   Please follow up with your primary care doctor to discuss your elevated blood pressure   What to Expect: - Generally, when lab tests are ordered the results can take up to 1 week for results to be available.  At that point, we will contact you to discuss your results with you.  Unless there is a critical result, we will typically wait for all of your lab results to be available before contacting you. - If a biopsy is part of your Care Plan, those results can take on average 7-10 days to result.  Once results are available, we will contact you to discuss your pathology results and any next steps. - If you have additional imaging ordered, such as a CT Scan, MRI, Ultrasound, Bone Scan, or PET scan, your imaging will need to be authorized then scheduled with the earliest available appointment.  You may be asked to travel to another hospital within [facility] who has a sooner availability, please consider doing so if asked. - If you use MyChart, your results will be available to you in the MyChart portal.  Your provider will be in touch with you as soon as all of your results are available to be discussed.  Your Diagnostic Clinic Provider:  Namon Cirri PA-C and Dr. Candise Che Your Diagnostic Navigator:  Chauncy Lean RN, office number  [phone]  If you or your caregiver have number blocking on your cell phones, please ensure the cancer center's numbers are not blocked.  If you are not a registered MyChart user, please consider enrolling in MyChart to receive your test results and visit notes.  You can also access your discharge instructions electronically.  MyChart also gives you an electronic means to communicate with your Care Team instead of needing to call in to the cancer center.  We appreciate you trusting [place] with your healthcare and look forward to partnering with you as we work to uncover what your potential diagnosis may be.  Please do not hesitate to reach out at any point with questions or concerns.

## 2024-01-24 NOTE — Progress Notes (Unsigned)
[facility] Telephone:([phone]) [phone]   [phone]  INITIAL CONSULTATION:  Patient Care Team: Carilyn Goodpasture, NP as PCP - General (Family Medicine) O'Neal, Ronnald Ramp, MD as PCP - Cardiology (Cardiology)  CHIEF COMPLAINTS/PURPOSE OF CONSULTATION:  "Compression fracture "  HISTORY OF PRESENTING ILLNESS:  Karen Villanueva 68 y.o. female with medical history significant for nonrheumatic mitral valve regurgitation, celiac disease, amyotrophic lateral sclerosis, osteoporosis, dysarthria, bilateral arm and leg weakness, hyperlipidemia, venous stasis.  On review of the previous records patient is being referred by Dr. Ranee Gosselin from [place]. She was initially seen in early December for ongoing mid back pain after helping her mother-in-law pivot into her wheelchair. CT scan of her chest was performed on on 12/28/2023 showing T12 compression fracture, vertebral plana, with 8mm retropulsed fragment causing significant canal stenosis. Radiologist also noted there is mild associated soft tissue edema.  MRI of lumbar spine was then ordered showing soft tissue suspicious area around the fracture and minimal cord compression of the cord.  MD documented that she had no cord symptoms at all.  MD also ordered a biopsy however it was unable to be performed per IR there is not enough bone to biopsy. Patient has been prescribed oxycodone and tramadol for pain. Patient had lab work on 01/13/24 and results showed a positive ANA IFA screen that was positive and a titer was sent out. The multiple myeloma panel and urine test were negative.  On exam today patient presents unaccompanied.  She states she is still having mid back pain however  it is mild in nature now.  She is able to manage the pain with Tylenol.  Declines needing prescriptions for pain medication.  She denies any weakness or numbness in her legs.  No difficulty walking although she admits to walking  slower because of the pain.  In addition to the back pain, the patient reports a new sensation in her right leg, where a rod was inserted following a femur fracture in 2014. The patient describes a 'pulling' sensation from the hip and a 'burning' sensation in the knee, which has been ongoing for a couple of weeks. The patient denies any recent falls or trauma to the leg.  She denies any fevers or weight loss.  Patient is up-to-date on breast and colorectal screenings. She had history of abnormal cells on pap smears which lead to a hysterectomy. She is a nonsmoker although did smoke for 4-5 years starting at age 22. She very rarely drinks alcohol.   MEDICAL HISTORY:  Past Medical History:  Diagnosis Date   ALS (amyotrophic lateral sclerosis) (HCC)    Celiac disease    Closed fracture of left distal radius    Left ulnar fracture    left   Osteoporosis     SURGICAL HISTORY: Past Surgical History:  Procedure Laterality Date   ABDOMINAL HYSTERECTOMY  1994   BUBBLE STUDY  07/06/2022   Procedure: BUBBLE STUDY;  Surgeon: Sande Rives, MD;  Location[facility] ENDOSCOPY;  Service: Cardiovascular;;   FEMUR FRACTURE SURGERY Right    OPEN REDUCTION INTERNAL FIXATION (ORIF) DISTAL RADIAL FRACTURE Left 10/31/2019   Procedure: OPEN REDUCTION INTERNAL FIXATION (ORIF) LEFT DISTAL RADIAL FRACTURE,  CLOSED REDUCTION LEFT ULNA;  Surgeon: Betha Loa, MD;  Location: [facility];  Service: Orthopedics;  Laterality: Left;   RIGHT/LEFT HEART CATH AND CORONARY ANGIOGRAPHY N/A 07/06/2022   Procedure: RIGHT/LEFT HEART CATH AND CORONARY ANGIOGRAPHY;  Surgeon: Kathleene Hazel, MD;  Location:  [facility] INVASIVE CV LAB;  Service: Cardiovascular;  Laterality: N/A;   TEE WITHOUT CARDIOVERSION N/A 07/06/2022   Procedure: TRANSESOPHAGEAL ECHOCARDIOGRAM (TEE);  Surgeon: Sande Rives, MD;  Location: [facility] ENDOSCOPY;  Service: Cardiovascular;  Laterality: N/A;    SOCIAL HISTORY: Social History    Socioeconomic History   Marital status: Married    Spouse name: Not on file   Number of children: 0   Years of education: Not on file   Highest education level: Not on file  Occupational History   Occupation: Part time Conservation officer, [employer]  Tobacco Use   Smoking status: Former   Smokeless tobacco: Never  Advertising account planner   Vaping status: Never Used  Substance and Sexual Activity   Alcohol use: Not Currently   Drug use: Never   Sexual activity: Not on file  Other Topics Concern   Not on file  Social History Narrative   Not on file   Social Drivers of Health   Financial Resource Strain: Not on file  Food Insecurity: Not on file  Transportation Needs: No Transportation Needs (08/18/2022)   Received from [facility]   PRAPARE - Transportation    In the past 12 months, has lack of transportation kept you from medical appointments or from getting medications?: No    Lack of Transportation (Non-Medical): No  Physical Activity: Not on file  Stress: Not on file  Social Connections: Not on file  Intimate Partner Violence: Not on file    FAMILY HISTORY: Family History  Problem Relation Age of Onset   Heart failure Mother    Diabetes Father     ALLERGIES:  is allergic to wheat.  MEDICATIONS:  Current Outpatient Medications  Medication Sig Dispense Refill   amoxicillin (AMOXIL) 500 MG tablet TAKE 4 TABLETS BY MOUTH 30-60 MINS PRIOR TO DENTAL WORK 4 tablet 3   aspirin EC 81 MG tablet Take 1 tablet (81 mg total) by mouth daily. Swallow whole. 90 tablet 3   diphenhydramine-acetaminophen (TYLENOL PM) 25-500 MG TABS tablet Take 2 tablets by mouth at bedtime as needed (sleep).     Tafluprost, PF, (ZIOPTAN) 0.0015 % SOLN Place 1 drop into both eyes at bedtime.     No current facility-administered medications for this visit.    REVIEW OF SYSTEMS:   All other systems are reviewed and are negative for acute change except as noted in the  HPI.  PHYSICAL EXAMINATION: ECOG PERFORMANCE STATUS: 1 - Symptomatic but completely ambulatory  There were no vitals filed for this visit. There were no vitals filed for this visit.  Physical Exam    LABORATORY DATA:  I have reviewed the data as listed    Latest Ref Rng & Units 07/06/2022    1:04 PM 07/06/2022    1:01 PM 06/29/2022    2:58 PM  CBC  WBC 3.4 - 10.8 x10E3/uL   11.1   Hemoglobin 12.0 - 15.0 g/dL 9.9  96.0  45.4   Hematocrit 36.0 - 46.0 % 29.0  32.0  34.5   Platelets 150 - 450 x10E3/uL   297        Latest Ref Rng & Units 07/06/2022    1:04 PM 07/06/2022    1:01 PM 06/08/2022   10:26 AM  CMP  Glucose 70 - 99 mg/dL   93   BUN 8 - 27 mg/dL   13   Creatinine 0.98 - 1.00 mg/dL   1.19   Sodium 147 - 829  mmol/L 145  144  138   Potassium 3.5 - 5.1 mmol/L 3.6  3.9  4.1   Chloride 96 - 106 mmol/L   105   CO2 20 - 29 mmol/L   20   Calcium 8.7 - 10.3 mg/dL   9.5      RADIOGRAPHIC STUDIES: I have personally reviewed the radiological images as listed and agreed with the findings in the report. No results found.  ASSESSMENT & PLAN Karen Villanueva is a 68 y.o. female presenting [facility] for consultation regarding pathologic vs compression fracture. We have reviewed etiologies including metastatic disease, multiple myeloma, worsening osteoporosis. Patient will proceed with laboratory workup today.   #Compression vs pathologic fracture  - Labs collected today include: CBC, CMP, LDH. Will complete multiple myeloma work up with serum free light chains and full body PET scan. Breast tumor markers and UA with culture also collected.  #Age related screenings -UTD  -Patient will RTC when work up is complete.  Patient expressed understanding of the recommended workup and is agreeable to move forward.   All questions were answered. The patient knows to call the clinic with any problems, questions or concerns.  Shared visit with Dr. Candise Che  Orders Placed  This Encounter  Procedures   Culture, Urine    Standing Status:   Future    Number of Occurrences:   1    Expected Date:   01/24/2024    Expiration Date:   01/23/2025   NM PET Image Initial (PI) Whole Body    Standing Status:   Future    Expected Date:   01/31/2024    Expiration Date:   01/23/2025    If indicated for the ordered procedure, I authorize the administration of a radiopharmaceutical per Radiology protocol:   Yes    Preferred imaging location?:   [facility]   Kappa/lambda light chains   CBC with Differential (Cancer Center Only)    Standing Status:   Future    Number of Occurrences:   1    Expiration Date:   01/23/2025   CMP (Cancer Center only)    Standing Status:   Future    Number of Occurrences:   1    Expiration Date:   01/23/2025   Multiple Myeloma Panel (SPEP&IFE w/QIG)   CA 15.3    Standing Status:   Future    Number of Occurrences:   1    Expected Date:   01/24/2024    Expiration Date:   01/23/2025   CA 27.29    Standing Status:   Future    Number of Occurrences:   1    Expected Date:   01/24/2024    Expiration Date:   01/23/2025   Lactate dehydrogenase (LDH)    Standing Status:   Future    Number of Occurrences:   1    Expected Date:   01/24/2024    Expiration Date:   01/23/2025   Urinalysis, Complete w Microscopic    Standing Status:   Future    Number of Occurrences:   1    Expected Date:   01/24/2024    Expiration Date:   01/23/2025      I have spent a total of 60 minutes minutes of face-to-face and non-face-to-face time, preparing to see the patient, obtaining and/or reviewing separately obtained history, performing a medically appropriate examination, counseling and educating the patient, ordering medications/tests/procedures, referring and communicating with other health care professionals, documenting clinical  information in the electronic health record, independently interpreting results and communicating results to the patient, and care coordination.    Namon Cirri PA-C Department of Hematology/Oncology [facility] Phone: [phone]    ADDENDUM  .Patient was Personally and independently interviewed, examined and relevant elements of the history of present illness were reviewed in details and an assessment and plan was created. All elements of the patient's history of present illness , assessment and plan were discussed in details with Namon Cirri PA-C. The above documentation reflects our combined findings assessment and plan.   Wyvonnia Lora MD MS

## 2024-01-24 NOTE — Progress Notes (Unsigned)
Patient in clinic, by herself, for scheduled appointment with [facility]. Patient was provided with my contact information for any questions/concerns she may have. Patient denies questions with me at this time.

## 2024-01-25 LAB — CANCER ANTIGEN 15-3: CA 15-3: 11.2 U/mL (ref 0.0–25.0)

## 2024-01-25 LAB — URINE CULTURE: Culture: <10000 — AB

## 2024-01-25 LAB — CANCER ANTIGEN 27.29: CA 27.29: 14.1 U/mL (ref 0.0–38.6)

## 2024-01-26 ENCOUNTER — Encounter: Payer: Self-pay | Admitting: Medical Oncology

## 2024-01-26 LAB — KAPPA/LAMBDA LIGHT CHAINS
Kappa free light chain: 24.3 mg/L — ABNORMAL HIGH (ref 3.3–19.4)
Kappa, lambda light chain ratio: 1.23 (ref 0.26–1.65)
Lambda free light chains: 19.8 mg/L (ref 5.7–26.3)

## 2024-01-26 NOTE — Progress Notes (Signed)
[facility]  Call to patient to remind her of her scheduled PET in the morning at 0945, patient knows to arrive 15 minutes early. Reminded patient nothing to eat 6 hours prior and only plain water. Patient gave verbal confirmation. Denies questions.   Rexene Edison, RN, BSN, [facility] Oncology Nurse Navigator, [facility] 01/26/2024 4:05 PM

## 2024-01-27 ENCOUNTER — Encounter (HOSPITAL_COMMUNITY)
Admission: RE | Admit: 2024-01-27 | Discharge: 2024-01-27 | Disposition: A | Discharge status: Home / Self Care | Payer: Medicare HMO | Source: Ambulatory Visit | Attending: Physician Assistant | Admitting: Physician Assistant

## 2024-01-27 DIAGNOSIS — M8088XG Other osteoporosis with current pathological fracture, vertebra(e), subsequent encounter for fracture with delayed healing: Secondary | ICD-10-CM | POA: Insufficient documentation

## 2024-01-27 DIAGNOSIS — J392 Other diseases of pharynx: Secondary | ICD-10-CM | POA: Diagnosis not present

## 2024-01-27 DIAGNOSIS — C9000 Multiple myeloma not having achieved remission: Secondary | ICD-10-CM | POA: Diagnosis not present

## 2024-01-27 DIAGNOSIS — I700 Atherosclerosis of aorta: Secondary | ICD-10-CM | POA: Diagnosis not present

## 2024-01-27 DIAGNOSIS — R930 Abnormal findings on diagnostic imaging of skull and head, not elsewhere classified: Secondary | ICD-10-CM | POA: Diagnosis not present

## 2024-01-27 DIAGNOSIS — M8080XG Other osteoporosis with current pathological fracture, unspecified site, subsequent encounter for fracture with delayed healing: Secondary | ICD-10-CM | POA: Diagnosis present

## 2024-01-27 LAB — MULTIPLE MYELOMA PANEL, SERUM
Albumin SerPl Elph-Mcnc: 3.9 g/dL (ref 2.9–4.4)
Albumin/Glob SerPl: 1.1 (ref 0.7–1.7)
Alpha 1: 0.3 g/dL (ref 0.0–0.4)
Alpha2 Glob SerPl Elph-Mcnc: 1.1 g/dL — ABNORMAL HIGH (ref 0.4–1.0)
B-Globulin SerPl Elph-Mcnc: 1.2 g/dL (ref 0.7–1.3)
Gamma Glob SerPl Elph-Mcnc: 1.2 g/dL (ref 0.4–1.8)
Globulin, Total: 3.7 g/dL (ref 2.2–3.9)
IgA: 162 mg/dL (ref 87–352)
IgG (Immunoglobin G), Serum: 1081 mg/dL (ref 586–1602)
IgM (Immunoglobulin M), Srm: 209 mg/dL (ref 26–217)
Total Protein ELP: 7.6 g/dL (ref 6.0–8.5)

## 2024-01-27 LAB — GLUCOSE, CAPILLARY: Glucose-Capillary: 104 mg/dL — ABNORMAL HIGH (ref 70–99)

## 2024-01-27 MED ORDER — FLUDEOXYGLUCOSE F - 18 (FDG) INJECTION: 7.3000 | Freq: Once | INTRAVENOUS | Status: DC

## 2024-01-27 MED ORDER — FLUDEOXYGLUCOSE F - 18 (FDG) INJECTION
7.2700 | Freq: Once | INTRAVENOUS | Status: AC | PRN
  Administered 2024-01-27: 7.27 via INTRAVENOUS

## 2024-01-28 ENCOUNTER — Telehealth: Payer: Self-pay | Admitting: Physician Assistant

## 2024-01-28 NOTE — Telephone Encounter (Signed)
I notified Karen Villanueva by phone regarding lab results. Labs are not consistent with multiple myeloma as kappa lambda light chain ration was normal and there was no M protein observed in multiple myeloma panel. Breast tumor markers are negative. Patient had PET scan yesterday. I will follow up with her once those results are available. All of patient's questions were answered and she expressed understanding of the plan provided.

## 2024-02-08 ENCOUNTER — Telehealth: Payer: Self-pay | Admitting: Hematology

## 2024-02-08 NOTE — Telephone Encounter (Signed)
I called and discussed with [name] results from the recent PET/CT scan and her labs.  Her labs did not show any overt evidence of multiple myeloma and her breast cancer tumor markers were negative.  PET/CT scan shows 1. Very mild hypermetabolism associated with a T12 compression fracture. Otherwise, no evidence of multiple myeloma or other primary malignancy. 2. Hypermetabolic asymmetric right oropharyngeal soft tissue. Malignancy cannot be excluded. This should be amenable to direct inspection. 3. Hypermetabolism associated with a slightly thickened descending duodenum. This area is under distended, limiting further evaluation. Consider CT enterography in further evaluation, as clinically indicated. If a more aggressive approach is desired, upper endoscopy could be considered.   -I discussed that the degree of FDG avidity associated with a T12 compression fracture is not definitive or diagnostic of overt involvement by malignancy though low-grade lymphoma cannot be ruled out. -No other overt bone lesions noted that might suggest multiple myeloma or other bone metastatic process. -It is quite possible the T12 compression fracture is from osteoporosis however since there was some concern of soft tissue present on previous outside MRI by Dr. Leonia Corona will need additional follow-up. -If the T12 compression fracture requires surgical fixation then as a part of the surgical procedure sampling might be obtained to elucidate the exact etiology. -If orthopedics does not recommend surgical fixation and the patient will need interval reimaging of the spine in 4 to 6 weeks to evaluate for resolution of her compression fracture and to determine if there is any other soft tissue growth. -At this time the patient notes that her symptoms are progressively improved and she is needing as needed tramadol about once or twice a week and Tylenol once daily for pain control.  -We discussed that  the asymmetric right oropharyngeal soft tissue would need to be evaluated by an ENT physician and we shall initiate a referral with her consent.  -Patient also has nonspecific hypermetabolism and thickening in the descending duodenum.  This is in the context of known celiac disease.  Would refer the patient to GI for consideration of EGD for evaluation of this area to determine if this is active celiac disease or gluten enteropathy associated with low-grade lymphoma or other etiologies.   Wyvonnia Lora MD MS Hematology/Oncology Physician [facility]

## 2024-02-10 ENCOUNTER — Other Ambulatory Visit: Payer: Self-pay | Admitting: Physician Assistant

## 2024-02-10 DIAGNOSIS — R948 Abnormal results of function studies of other organs and systems: Secondary | ICD-10-CM

## 2024-02-10 NOTE — Progress Notes (Signed)
Referrals placed for both GI and ENT per Dr. Clyda Greener discussion with patient regarding PET scan results. Please see his telephone note for additional information.

## 2024-02-14 ENCOUNTER — Encounter: Payer: Self-pay | Admitting: Gastroenterology

## 2024-02-14 ENCOUNTER — Encounter: Payer: Self-pay | Admitting: Medical Oncology

## 2024-02-14 NOTE — Progress Notes (Signed)
 Follow up call to patient to confirm that she has a scheduled appointment with Dr. Leonides Schanz at the [facility]. Patient stated she did not and wasn't aware and stated she needed a referral. I informed patient that a referral was sent to [facility] and they attempted to contact her for an appointment. Patient stated she did not receive a call. I called [facility] to confirm that patient was not scheduled at this time. I called patient back and provided her with their phone number to schedule an appointment that works with her schedule. Patient confirmed she would. Patient thanked and encouraged to call me with questions/concerns.   Rexene Edison, RN, BSN, [facility] Oncology Nurse Navigator, [facility] 02/14/2024 2:04 PM

## 2024-02-15 DIAGNOSIS — H52209 Unspecified astigmatism, unspecified eye: Secondary | ICD-10-CM | POA: Diagnosis not present

## 2024-02-15 DIAGNOSIS — H5213 Myopia, bilateral: Secondary | ICD-10-CM | POA: Diagnosis not present

## 2024-02-15 DIAGNOSIS — H524 Presbyopia: Secondary | ICD-10-CM | POA: Diagnosis not present

## 2024-02-16 ENCOUNTER — Encounter: Payer: Self-pay | Admitting: Medical Oncology

## 2024-02-18 ENCOUNTER — Encounter (INDEPENDENT_AMBULATORY_CARE_PROVIDER_SITE_OTHER): Payer: Self-pay | Admitting: Otolaryngology

## 2024-02-18 ENCOUNTER — Ambulatory Visit (INDEPENDENT_AMBULATORY_CARE_PROVIDER_SITE_OTHER): Payer: Medicare HMO | Admitting: Otolaryngology

## 2024-02-18 VITALS — BP 172/105 | HR 86 | Ht 68.0 in | Wt 145.0 lb

## 2024-02-18 DIAGNOSIS — J392 Other diseases of pharynx: Secondary | ICD-10-CM

## 2024-02-18 DIAGNOSIS — R948 Abnormal results of function studies of other organs and systems: Secondary | ICD-10-CM | POA: Diagnosis not present

## 2024-02-18 NOTE — Progress Notes (Signed)
 ENT CONSULT:  Reason for Consult: abnormal findings on PET/CT right base of the tongue asymmetry/uptake   HPI: Discussed the use of AI scribe software for clinical note transcription with the patient, who gave verbal consent to proceed.  History of Present Illness   Karen Villanueva is a 68 year old female who presents with a referral for evaluation of a PET scan finding after a compression fracture at T12 . She was referred by a cancer doctor for evaluation of the PET scan findings c/w right base of the tongue fullness and FDG uptake.   She presents for evaluation following a PET scan conducted after sustaining a back injury on December 1st, resulting in a vertebral compression fracture. The initial evaluation by an orthopedic doctor revealed fluid around the fracture site, prompting a referral to a cancer doctor who ordered the PET scan. She was supposed to have IR biopsy of the T12 fx site, but it was cancelled because of the size of lesion being to small to biopsy.   She denies any history of multiple myeloma or other cancers. Her back pain has improved but persists, especially when standing for more than ten minutes, causing discomfort in the lower back. She experiences shortness of breath and dizziness when standing for prolonged periods, which she attributes to the back pain.  She has a history of low bone density, noted during previous bone scans. Her past medical history includes a heart valve repair in 2023. No history of stroke or heart attack.  No trouble with swallowing, pain with swallowing, voice changes, or lumps in her body or neck area. She mentions a change in weight following her heart surgery in 2023 but no recent significant weight loss. She also reports non-recurring pain in her upper right dental area, associated with her teeth/prior dental work.  Her social history includes smoking about forty years ago, with no current smoking or heavy alcohol use. She has no significant  exposure to secondhand smoke.     Records Reviewed:  Hx of T12 pahtologic fracture  Oncology Note 01/24/24 Karen Villanueva 68 y.o. female with medical history significant for nonrheumatic mitral valve regurgitation, celiac disease, amyotrophic lateral sclerosis, osteoporosis, dysarthria, bilateral arm and leg weakness, hyperlipidemia, venous stasis.   On review of the previous records patient is being referred by Dr. Ranee Gosselin from [place]. She was initially seen in early December for ongoing mid back pain after helping her mother-in-law pivot into her wheelchair. CT scan of her chest was performed on on 12/28/2023 showing T12 compression fracture, vertebral plana, with 8mm retropulsed fragment causing significant canal stenosis. Radiologist also noted there is mild associated soft tissue edema.  MRI of lumbar spine was then ordered showing soft tissue suspicious area around the fracture and minimal cord compression of the cord.  MD documented that she had no cord symptoms at all.  MD also ordered a biopsy however it was unable to be performed per IR there is not enough bone to biopsy. Patient has been prescribed oxycodone and tramadol for pain. Patient had lab work on 01/13/24 and results showed a positive ANA IFA screen that was positive and a titer was sent out. The multiple myeloma panel and urine test were negative.   On exam today patient presents unaccompanied.  She states she is still having mid back pain however  it is mild in nature now.  She is able to manage the pain with Tylenol.  Declines needing prescriptions for pain medication.  She denies  any weakness or numbness in her legs.  No difficulty walking although she admits to walking slower because of the pain.  In addition to the back pain, the patient reports a new sensation in her right leg, where a rod was inserted following a femur fracture in 2014. The patient describes a 'pulling' sensation from the hip and a 'burning' sensation in  the knee, which has been ongoing for a couple of weeks. The patient denies any recent falls or trauma to the leg.  She denies any fevers or weight loss.   Patient is up-to-date on breast and colorectal screenings. She had history of abnormal cells on pap smears which lead to a hysterectomy. She is a nonsmoker although did smoke for 4-5 years starting at age 30. She very rarely drinks alcohol.  ASSESSMENT & PLAN YAILEEN HOFFERBER is a 68 y.o. female presenting to [facility] for consultation regarding pathologic vs compression fracture. We have reviewed etiologies including metastatic disease, multiple myeloma, worsening osteoporosis. Patient will proceed with laboratory workup today.    #Compression vs pathologic fracture  - Labs collected today include: CBC, CMP, LDH. Will complete multiple myeloma work up with serum free light chains and full body PET scan. Breast tumor markers and UA with culture also collected.   Past Medical History:  Diagnosis Date   ALS (amyotrophic lateral sclerosis) (HCC)    Celiac disease    Closed fracture of left distal radius    Left ulnar fracture    left   Osteoporosis     Past Surgical History:  Procedure Laterality Date   ABDOMINAL HYSTERECTOMY  1994   BUBBLE STUDY  07/06/2022   Procedure: BUBBLE STUDY;  Surgeon: Sande Rives, MD;  Location: [facility];  Service: Cardiovascular;;   FEMUR FRACTURE SURGERY Right    OPEN REDUCTION INTERNAL FIXATION (ORIF) DISTAL RADIAL FRACTURE Left 10/31/2019   Procedure: OPEN REDUCTION INTERNAL FIXATION (ORIF) LEFT DISTAL RADIAL FRACTURE,  CLOSED REDUCTION LEFT ULNA;  Surgeon: Betha Loa, MD;  Location: [facility];  Service: Orthopedics;  Laterality: Left;   RIGHT/LEFT HEART CATH AND CORONARY ANGIOGRAPHY N/A 07/06/2022   Procedure: RIGHT/LEFT HEART CATH AND CORONARY ANGIOGRAPHY;  Surgeon: Kathleene Hazel, MD;  [facility]: [facility];  Service: Cardiovascular;  Laterality:  N/A;   TEE WITHOUT CARDIOVERSION N/A 07/06/2022   Procedure: TRANSESOPHAGEAL ECHOCARDIOGRAM (TEE);  Surgeon: Sande Rives, MD;  Location: [facility] ENDOSCOPY;  Service: Cardiovascular;  Laterality: N/A;    Family History  Problem Relation Age of Onset   Heart failure Mother    Diabetes Father     Social History:  reports that she has quit smoking. She has never used smokeless tobacco. She reports that she does not currently use alcohol. She reports that she does not use drugs.  Allergies:  Allergies  Allergen Reactions   Diclofenac Other (See Comments)   Wheat Diarrhea    Celiac Disease, NO GLUTEN     Medications: I have reviewed the patient's current medications.  The PMH, PSH, Medications, Allergies, and SH were reviewed and updated.  ROS: Constitutional: Negative for fever, weight loss and weight gain. Cardiovascular: Negative for chest pain and dyspnea on exertion. Respiratory: Is not experiencing shortness of breath at rest. Gastrointestinal: Negative for nausea and vomiting. Neurological: Negative for headaches. Psychiatric: The patient is not nervous/anxious  Blood pressure (!) 172/105, pulse 86, height 5\' 8"  (1.727 m), weight 145 lb (65.8 kg), SpO2 97%. Body mass index is 22.05 kg/m.  PHYSICAL EXAM:  Exam: General: Well-developed, well-nourished Communication and Voice: raspy Respiratory Respiratory effort: Equal inspiration and expiration without stridor Cardiovascular Peripheral Vascular: Warm extremities with equal color/perfusion Eyes: No nystagmus with equal extraocular motion bilaterally Neuro/Psych/Balance: Patient oriented to person, place, and time; Appropriate mood and affect; Gait is intact with no imbalance; Cranial nerves I-XII are intact Head and Face Inspection: Normocephalic and atraumatic without mass or lesion Palpation: Facial skeleton intact without bony stepoffs Salivary Glands: No mass or tenderness Facial Strength: Facial motility  symmetric and full bilaterally ENT Pinna: External ear intact and fully developed External canal: Canal is patent with intact skin Tympanic Membrane: Clear and mobile External Nose: No scar or anatomic deformity Internal Nose: Septum is S-shaped. No polyp, or purulence. Mucosal edema and erythema present.  Bilateral inferior turbinate hypertrophy.  Lips, Teeth, and gums: Mucosa and teeth intact and viable TMJ: No pain to palpation with full mobility Oral cavity/oropharynx: No erythema or exudate, no lesions present, asymmetry of the right base of the tongue, but no discrete lesions or masses noted Nasopharynx: No mass or lesion with intact mucosa Hypopharynx: Intact mucosa without pooling of secretions Larynx Glottic: Full true vocal cord mobility without lesion or mass Supraglottic: Normal appearing epiglottis and AE folds Interarytenoid Space: Moderate pachydermia&edema Subglottic Space: Patent without lesion or edema Neck Neck and Trachea: Midline trachea without mass or lesion Thyroid: No mass or nodularity Lymphatics: No lymphadenopathy  Procedure: Preoperative diagnosis: base of the tongue asymmetry and FDG uptake on PET/CT  Postoperative diagnosis:   Same  Procedure: Flexible fiberoptic laryngoscopy  Surgeon: Ashok Croon, MD  Anesthesia: Topical lidocaine and Afrin Complications: None Condition is stable throughout exam  Indications and consent:  The patient presents to the clinic with above symptoms. Indirect laryngoscopy view was incomplete. Thus it was recommended that they undergo a flexible fiberoptic laryngoscopy. All of the risks, benefits, and potential complications were reviewed with the patient preoperatively and verbal informed consent was obtained.  Procedure: The patient was seated upright in the clinic. Topical lidocaine and Afrin were applied to the nasal cavity. After adequate anesthesia had occurred, I then proceeded to pass the flexible telescope  into the nasal cavity. The nasal cavity was patent without rhinorrhea or polyp. The nasopharynx was also patent without mass or lesion. The base of tongue was visualized and was normal. There were no signs of pooling of secretions in the piriform sinuses. The true vocal folds were mobile bilaterally. There were no signs of glottic or supraglottic mucosal lesion or mass. There was moderate interarytenoid pachydermia and post cricoid edema. The telescope was then slowly withdrawn and the patient tolerated the procedure throughout.      Studies Reviewed: PET/CT 01/27/24 IMPRESSION: 1. Very mild hypermetabolism associated with a T12 compression fracture. Otherwise, no evidence of multiple myeloma or other primary malignancy. 2. Hypermetabolic asymmetric right oropharyngeal soft tissue. Malignancy cannot be excluded. This should be amenable to direct inspection. 3. Hypermetabolism associated with a slightly thickened descending duodenum. This area is under distended, limiting further evaluation. Consider CT enterography in further evaluation, as clinically indicated. If a more aggressive approach is desired, upper endoscopy could be considered.   Assessment/Plan: Encounter Diagnoses  Name Primary?   Oropharyngeal lesion Yes   Abnormal positron emission tomography (PET) scan     Assessment and Plan    Asymmetry Base of the Tongue right side with FDG uptake Noted on PET scan. No risk factors for head and neck cancer, remote hx of smoking, quit 40  yrs ago, denies ETOH, denies neck masses or hemoptysis, trouble with swallowing, weight changes, voice changes. Differential diagnosis includes low-grade infection at the time of PET/CT involving lingual tonsil or normal variant. No known cancer diagnosis in the past, had T12 pathologic fx, undergoing workup for multiple myeloma, and PET/CT was ordered as part of the workup. No lymphadenopathy or other concerning signs on exam. Discussed serial  imaging vs biopsy. Patient prefers to avoid biopsy and I think it is reasonable based on clinical picture. Oncology is on board with the plan, care discussed with Dr Candise Che, Oncology. Plan to repeat imaging in six months to monitor for changes. - Schedule follow-up in six months - will re-scope when she returns  - Order CT neck with contrast scan for August 2025   Compression Fracture T12 Compression fracture in the lower back identified in December. Patient reports improved pain but discomfort persists after standing for more than ten minutes. No history of malignancy/multiple myeloma to date - Follow-up with ortho and oncology  Follow-up - Schedule follow-up appointment in six months - Ensure CT neck w/con is scheduled for August 2025   Thank you for allowing me to participate in the care of this patient. Please do not hesitate to contact me with any questions or concerns.   Ashok Croon, MD Otolaryngology [facility] Phone: [phone] Fax: [phone]    02/18/2024, 11:47 AM

## 2024-02-21 DIAGNOSIS — M8448XA Pathological fracture, other site, initial encounter for fracture: Secondary | ICD-10-CM | POA: Diagnosis not present

## 2024-03-28 ENCOUNTER — Other Ambulatory Visit

## 2024-03-28 ENCOUNTER — Encounter: Payer: Self-pay | Admitting: Gastroenterology

## 2024-03-28 ENCOUNTER — Other Ambulatory Visit (INDEPENDENT_AMBULATORY_CARE_PROVIDER_SITE_OTHER)

## 2024-03-28 ENCOUNTER — Ambulatory Visit (INDEPENDENT_AMBULATORY_CARE_PROVIDER_SITE_OTHER): Payer: Medicare HMO | Admitting: Gastroenterology

## 2024-03-28 VITALS — BP 120/64 | HR 70 | Ht 68.0 in | Wt 146.0 lb

## 2024-03-28 DIAGNOSIS — R197 Diarrhea, unspecified: Secondary | ICD-10-CM

## 2024-03-28 DIAGNOSIS — R933 Abnormal findings on diagnostic imaging of other parts of digestive tract: Secondary | ICD-10-CM | POA: Diagnosis not present

## 2024-03-28 DIAGNOSIS — K900 Celiac disease: Secondary | ICD-10-CM

## 2024-03-28 DIAGNOSIS — Z87440 Personal history of urinary (tract) infections: Secondary | ICD-10-CM | POA: Diagnosis not present

## 2024-03-28 DIAGNOSIS — K529 Noninfective gastroenteritis and colitis, unspecified: Secondary | ICD-10-CM

## 2024-03-28 DIAGNOSIS — R350 Frequency of micturition: Secondary | ICD-10-CM

## 2024-03-28 DIAGNOSIS — R948 Abnormal results of function studies of other organs and systems: Secondary | ICD-10-CM | POA: Diagnosis not present

## 2024-03-28 DIAGNOSIS — N3090 Cystitis, unspecified without hematuria: Secondary | ICD-10-CM | POA: Diagnosis not present

## 2024-03-28 DIAGNOSIS — R300 Dysuria: Secondary | ICD-10-CM

## 2024-03-28 LAB — CBC WITH DIFFERENTIAL/PLATELET
Basophils Absolute: 0.1 10*3/uL (ref 0.0–0.1)
Basophils Relative: 1 % (ref 0.0–3.0)
Eosinophils Absolute: 0.3 10*3/uL (ref 0.0–0.7)
Eosinophils Relative: 3.1 % (ref 0.0–5.0)
HCT: 36.3 % (ref 36.0–46.0)
Hemoglobin: 11.3 g/dL — ABNORMAL LOW (ref 12.0–15.0)
Lymphocytes Relative: 18.9 % (ref 12.0–46.0)
Lymphs Abs: 1.8 10*3/uL (ref 0.7–4.0)
MCHC: 31.1 g/dL (ref 30.0–36.0)
MCV: 76.2 fl — ABNORMAL LOW (ref 78.0–100.0)
Monocytes Absolute: 0.6 10*3/uL (ref 0.1–1.0)
Monocytes Relative: 6 % (ref 3.0–12.0)
Neutro Abs: 6.9 10*3/uL (ref 1.4–7.7)
Neutrophils Relative %: 71 % (ref 43.0–77.0)
Platelets: 428 10*3/uL — ABNORMAL HIGH (ref 150.0–400.0)
RBC: 4.77 Mil/uL (ref 3.87–5.11)
RDW: 17.8 % — ABNORMAL HIGH (ref 11.5–15.5)
WBC: 9.7 10*3/uL (ref 4.0–10.5)

## 2024-03-28 LAB — BASIC METABOLIC PANEL WITH GFR
BUN: 16 mg/dL (ref 6–23)
CO2: 25 meq/L (ref 19–32)
Calcium: 10.2 mg/dL (ref 8.4–10.5)
Chloride: 104 meq/L (ref 96–112)
Creatinine, Ser: 0.87 mg/dL (ref 0.40–1.20)
GFR: 68.48 mL/min (ref >60.00)
Glucose, Bld: 94 mg/dL (ref 70–99)
Potassium: 3.8 meq/L (ref 3.5–5.1)
Sodium: 139 meq/L (ref 135–145)

## 2024-03-28 LAB — URINALYSIS WITH CULTURE, IF INDICATED
Bilirubin Urine: NEGATIVE
Ketones, ur: NEGATIVE
Nitrite: NEGATIVE
Specific Gravity, Urine: 1.02 (ref 1.000–1.030)
Total Protein, Urine: NEGATIVE
Urine Glucose: NEGATIVE
Urobilinogen, UA: 0.2 (ref 0.0–1.0)
pH: 6 (ref 5.0–8.0)

## 2024-03-28 LAB — C-REACTIVE PROTEIN: CRP: <1 mg/dL (ref 0.5–20.0)

## 2024-03-28 NOTE — Progress Notes (Signed)
 Chief Complaint:Abnormal positron emission tomography (PET) scan  Primary GI Doctor:Dr. Leonides Schanz  HPI:  Patient is a 68  year old female/female patient with past medical history of nonrheumatic mitral valve regurgitation, celiac disease, amyotrophic lateral sclerosis, osteoporosis, dysarthria, bilateral arm and leg weakness, hyperlipidemia, venous stasis, who was referred to me by Shanon Ace,* on 02/10/24 for a complaint of Abnormal positron emission tomography (PET) scan.  01/24/24 presented to the [facility] for consultation regarding pathologic vs compression fracture. Possible  etiologies including metastatic disease, multiple myeloma, worsening osteoporosis.  Labs ordered and full body PET scan.Breast tumor markers and UA with culture also collected.   Results:Labs are not consistent with multiple myeloma as kappa lambda light chain ration was normal and there was no M protein observed in multiple myeloma panel. Breast tumor markers are negative.  01/27/24 PET scan IMPRESSION: 1. Very mild hypermetabolism associated with a T12 compression fracture. Otherwise, no evidence of multiple myeloma or other primary malignancy. 2. Hypermetabolic asymmetric right oropharyngeal soft tissue. Malignancy cannot be excluded. This should be amenable to direct inspection. 3. Hypermetabolism associated with a slightly thickened descending duodenum. This area is under distended, limiting further evaluation. Consider CT enterography in further evaluation, as clinically indicated. If a more aggressive approach is desired, upper endoscopy could be considered. 4.  Aortic atherosclerosis (ICD10-I70.0).  02/08/24 per Oncologist Dr. Wyvonnia Lora "Patient also has nonspecific hypermetabolism and thickening in the descending duodenum.  This is in the context of known celiac disease.  Would refer the patient to GI for consideration of EGD for evaluation of this area to determine if this is active celiac  disease or gluten enteropathy associated with low-grade lymphoma or other etiologies."  02/18/24 ENT flexible fiberoptic laryngoscopy-The nasal cavity was patent without rhinorrhea or polyp. The nasopharynx was also patent without mass or lesion. The base of tongue was visualized and was normal. There were no signs of pooling of secretions in the piriform sinuses. The true vocal folds were mobile bilaterally. There were no signs of glottic or supraglottic mucosal lesion or mass. There was moderate interarytenoid pachydermia and post cricoid edema.   Discussed serial imaging vs biopsy. Patient prefers to avoid biopsy and I think it is reasonable based on clinical picture. Oncology is on board with the plan, care discussed with Dr Candise Che, Oncology. Plan to repeat imaging in six months to monitor for changes.     Interval History  Patient presents to discuss results of recent PET scan showing slightly thickened descending duodenum. Patient was diagnosed with celiac disease when she was in her 16's with endoscopy in [place] Dr. Ocie Doyne. She states she follows gluten free diet about 95% of the time. She states she has 2 stools per day unformed which is her norm. No blood in stool. No abdominal pain or bloating. No recent exposure or illness.     Patient denies GERD and dysphagia. Patient denies nausea, vomiting, or weight loss.  She is a nonsmoker although did smoke for 4-5 years starting at age 13.   She very rarely drinks alcohol.   She continues with mid/lower back pain that affects her gait. Patient taking baby aspirin.  Patient's family history includes father with several gastrointestinal issues possible celiac.   Patient has pain with urination, bladder spasms, and cloudy urine. Request urine test today. History of UTI's, states last one was over a year ago.   Wt Readings from Last 3 Encounters:  03/28/24 146 lb (66.2 kg)  02/18/24 145  lb (65.8 kg)  01/24/24 146 lb 1.6 oz  (66.3 kg)    Past Medical History:  Diagnosis Date   ALS (amyotrophic lateral sclerosis) (HCC)    Celiac disease    Closed fracture of left distal radius    Left ulnar fracture    left   Osteoporosis    Past Surgical History:  Procedure Laterality Date   ABDOMINAL HYSTERECTOMY  1994   has one ovary left   BUBBLE STUDY  07/06/2022   Procedure: BUBBLE STUDY;  Surgeon: Sande Rives, MD;  Location: [facility];  Service: Cardiovascular;;   FEMUR FRACTURE SURGERY Right    OPEN REDUCTION INTERNAL FIXATION (ORIF) DISTAL RADIAL FRACTURE Left 10/31/2019   Procedure: OPEN REDUCTION INTERNAL FIXATION (ORIF) LEFT DISTAL RADIAL FRACTURE,  CLOSED REDUCTION LEFT ULNA;  Surgeon: Betha Loa, MD;  Location: [facility];  Service: Orthopedics;  Laterality: Left;   RIGHT/LEFT HEART CATH AND CORONARY ANGIOGRAPHY N/A 07/06/2022   Procedure: RIGHT/LEFT HEART CATH AND CORONARY ANGIOGRAPHY;  Surgeon: Kathleene Hazel, MD;  Location: [facility];  Service: Cardiovascular;  Laterality: N/A;   TEE WITHOUT CARDIOVERSION N/A 07/06/2022   Procedure: TRANSESOPHAGEAL ECHOCARDIOGRAM (TEE);  Surgeon: Sande Rives, MD;  Location: [facility] ENDOSCOPY;  Service: Cardiovascular;  Laterality: N/A;    Current Outpatient Medications  Medication Sig Dispense Refill   amoxicillin (AMOXIL) 500 MG tablet TAKE 4 TABLETS BY MOUTH 30-60 MINS PRIOR TO DENTAL WORK 4 tablet 3   Ascorbic Acid (VITAMIN C PO) Take by mouth. daily     aspirin EC 81 MG tablet Take 1 tablet (81 mg total) by mouth daily. Swallow whole. 90 tablet 3   diphenhydramine-acetaminophen (TYLENOL PM) 25-500 MG TABS tablet Take 2 tablets by mouth at bedtime as needed (sleep).     rosuvastatin (CRESTOR) 5 MG tablet Take 5 mg by mouth daily.     Tafluprost, PF, (ZIOPTAN) 0.0015 % SOLN Place 1 drop into both eyes at bedtime.     VITAMIN D PO Take by mouth. 2 every AM     No current facility-administered medications for this  visit.    Allergies as of 03/28/2024 - Review Complete 03/28/2024  Allergen Reaction Noted   Diclofenac Other (See Comments) 02/18/2024   Wheat Diarrhea 03/20/2013    Family History  Problem Relation Age of Onset   Heart failure Mother    Diabetes Father    Colon cancer Neg Hx    Esophageal cancer Neg Hx     Review of Systems:    Constitutional: No weight loss, fever, chills, weakness or fatigue HEENT: Eyes: No change in vision               Ears, Nose, Throat:  No change in hearing or congestion Skin: No rash or itching Cardiovascular: No chest pain, chest pressure or palpitations   Respiratory: No SOB or cough Gastrointestinal: See HPI and otherwise negative Genitourinary: No dysuria or change in urinary frequency Neurological: No headache, dizziness or syncope Musculoskeletal: No new muscle or joint pain Hematologic: No bleeding or bruising Psychiatric: No history of depression or anxiety    Physical Exam:  Vital signs: BP 120/64   Pulse 70   Ht 5\' 8"  (1.727 m)   Wt 146 lb (66.2 kg)   BMI 22.20 kg/m   Constitutional: Pleasant  female appears to be in NAD, Well developed, Well nourished, alert and cooperative Throat: Oral cavity and pharynx without inflammation, swelling or lesion.  Respiratory: Respirations even and unlabored.  Lungs clear to auscultation bilaterally.   No wheezes, crackles, or rhonchi.  Cardiovascular: Normal S1, S2. Regular rate and rhythm. No peripheral edema, cyanosis or pallor.  Gastrointestinal:  Soft, nondistended, nontender. No rebound or guarding. Hyper active bowel sounds. No appreciable masses or hepatomegaly. Rectal:  Not performed.  Msk:  Symmetrical without gross deformities. Without edema, no deformity or joint abnormality.  Neurologic:  Alert and  oriented x4;  grossly normal neurologically.  Skin:   Dry and intact without significant lesions or rashes. Psychiatric: Oriented to person, place and time. Demonstrates good judgement and  reason without abnormal affect or behaviors.  RELEVANT LABS AND IMAGING: CBC    Latest Ref Rng & Units 01/24/2024    2:16 PM 07/06/2022    1:04 PM 07/06/2022    1:01 PM  CBC  WBC 4.0 - 10.5 K/uL 7.5     Hemoglobin 12.0 - 15.0 g/dL 91.4  9.9  78.2   Hematocrit 36.0 - 46.0 % 36.6  29.0  32.0   Platelets 150 - 400 K/uL 430        CMP     Latest Ref Rng & Units 01/24/2024    2:16 PM 07/06/2022    1:04 PM 07/06/2022    1:01 PM  CMP  Glucose 70 - 99 mg/dL 99     BUN 8 - 23 mg/dL 18     Creatinine 9.56 - 1.00 mg/dL 2.13     Sodium 086 - 578 mmol/L 138  145  144   Potassium 3.5 - 5.1 mmol/L 3.9  3.6  3.9   Chloride 98 - 111 mmol/L 106     CO2 22 - 32 mmol/L 25     Calcium 8.9 - 10.3 mg/dL 46.9     Total Protein 6.5 - 8.1 g/dL 7.8     Total Bilirubin 0.0 - 1.2 mg/dL 0.4     Alkaline Phos 38 - 126 U/L 95     AST 15 - 41 U/L 14     ALT 0 - 44 U/L 10        Lab Results  Component Value Date   TSH 2.950 06/08/2022   01/24/24 labs show: WBC 7.5, hemoglobin 11.7, platelets 430, BUN 18, creatinine 1.01, AST 14, CA 15-3 11.2, LDH 133, multiple myeloma [place] panel, kappa/lambda light chains 11/22/23 echo-Left ventricular ejection fraction, by estimation, is 55%.  02/23/2018 colonoscopy- normal colonoscopy, repeat 10 years Assessment: Encounter Diagnoses  Name Primary?   Celiac disease Yes   Diarrhea, unspecified type    Abnormal gastrointestinal positron emission tomography (PET) scan    Dysuria    Urine frequency    68 year old female patient who presents for evaluation of abnormal findings on PET/CT of duodenum. She admits to compliance with gluten free diet for history oc celiac disease.Will check inflammatory markers today. She has had chronic diarrhea. Will scheduled upper GI endoscopy with small bowel biopsy to evaluate finding on PET scan to determine if this is active celiac disease or gluten enteropathy associated with low-grade lymphoma or other etiologies. Pt agrees to plan. She  is also having dysuria and urine frequency most likely due to chronic diarrhea. I will order UA with culture, however explained to patient will defer results to PCP and she Tennile Styles need urology consult for chronic UTI's.  Plan: - Order UA with culture - Check CRP, TTG IgA, IgA, CBC, BMET today - Reinforced Celiac free diet -Schedule EGD in [facility] with Dr. Leonides Schanz with small bowel biopsies.  The risks and benefits of EGD with possible biopsies and esophageal dilation were discussed with the patient who agrees to proceed.   Thank you for the courtesy of this consult. Please call me with any questions or concerns.   Kyleigha Markert, FNP-C [facility] 03/28/2024, 10:03 AM  Cc: Shanon Ace

## 2024-03-28 NOTE — Patient Instructions (Signed)
 Your provider has requested that you go to the basement level for lab work before leaving today. Press "B" on the elevator. The lab is located at the first door on the left as you exit the elevator.  You have been scheduled for an endoscopy. Please follow written instructions given to you at your visit today.  If you use inhalers (even only as needed), please bring them with you on the day of your procedure.  If you take any of the following medications, they will need to be adjusted prior to your procedure:   DO NOT TAKE 7 DAYS PRIOR TO TEST- Trulicity (dulaglutide) Ozempic, Wegovy (semaglutide) Mounjaro (tirzepatide) Bydureon Bcise (exanatide extended release)  DO NOT TAKE 1 DAY PRIOR TO YOUR TEST Rybelsus (semaglutide) Adlyxin (lixisenatide) Victoza (liraglutide) Byetta (exanatide) ________________________________________________________  Thank you for choosing me and [facility].  Deanna May, NP

## 2024-03-29 ENCOUNTER — Telehealth: Payer: Self-pay | Admitting: Gastroenterology

## 2024-03-29 ENCOUNTER — Other Ambulatory Visit: Payer: Self-pay

## 2024-03-29 DIAGNOSIS — N390 Urinary tract infection, site not specified: Secondary | ICD-10-CM

## 2024-03-29 LAB — TISSUE TRANSGLUTAMINASE, IGA: (tTG) Ab, IgA: <1 U/mL

## 2024-03-29 LAB — IGA: Immunoglobulin A: 168 mg/dL (ref 70–320)

## 2024-03-29 MED ORDER — NITROFURANTOIN MONOHYD MACRO 100 MG PO CAPS: 100.0000 mg | ORAL_CAPSULE | Freq: Two times a day (BID) | ORAL | 0 refills | Status: AC

## 2024-03-29 NOTE — Progress Notes (Signed)
 I agree with the assessment plan as outlined by Ms. May

## 2024-03-29 NOTE — Telephone Encounter (Signed)
 Left message for pt to call back

## 2024-03-29 NOTE — Telephone Encounter (Signed)
 Pt made aware of Dr. Leonides Schanz recommendations: Prescription sent to pharmacy. Pt made aware.  Pt verbalized understanding with all questions answered.

## 2024-03-29 NOTE — Telephone Encounter (Signed)
 Okay to prescribe nitrofurantoin 100 mg BID for 5 days for treatment of UTI

## 2024-03-29 NOTE — Telephone Encounter (Signed)
 PT is calling to find out if she can be prescribed an antibiotic. Please advise.

## 2024-03-29 NOTE — Telephone Encounter (Signed)
 Pt stated that she is having frequent painful urination, along with foul smelling urine. Pt requesting antibiotics. Pt recently seen Karen May NP in office. Urinalysis completed. Results available in Epic. Please review and advise.

## 2024-03-30 ENCOUNTER — Other Ambulatory Visit: Payer: Self-pay | Admitting: Physician Assistant

## 2024-03-30 LAB — URINE CULTURE
MICRO NUMBER:: 16273840
SPECIMEN QUALITY:: ADEQUATE

## 2024-03-30 NOTE — Progress Notes (Signed)
 Contacted pt for an update on her Ortho appointment. Pt stated that she was doing better and that her back was "healed up" . Pt has appts with GI and ENT coming up.

## 2024-04-03 ENCOUNTER — Other Ambulatory Visit: Payer: Self-pay | Admitting: Physician Assistant

## 2024-04-03 DIAGNOSIS — K900 Celiac disease: Secondary | ICD-10-CM

## 2024-04-03 DIAGNOSIS — R399 Unspecified symptoms and signs involving the genitourinary system: Secondary | ICD-10-CM | POA: Diagnosis not present

## 2024-04-03 DIAGNOSIS — D649 Anemia, unspecified: Secondary | ICD-10-CM

## 2024-04-03 DIAGNOSIS — R030 Elevated blood-pressure reading, without diagnosis of hypertension: Secondary | ICD-10-CM | POA: Diagnosis not present

## 2024-04-03 DIAGNOSIS — N390 Urinary tract infection, site not specified: Secondary | ICD-10-CM | POA: Diagnosis not present

## 2024-04-03 DIAGNOSIS — K529 Noninfective gastroenteritis and colitis, unspecified: Secondary | ICD-10-CM | POA: Diagnosis not present

## 2024-04-05 NOTE — Progress Notes (Unsigned)
  Cardiology Office Note:  .   Date:  04/05/2024  ID:  Karen Villanueva, DOB 28-Jul-1956, MRN [id number] PCP: Carilyn Goodpasture, NP  [facility] Providers Cardiologist:  Reatha Harps, MD { Click to update primary MD,subspecialty MD or APP then REFRESH:1}   History of Present Illness: .   No chief complaint on file.   Karen Villanueva is a 68 y.o. female with history of MV repair, pHTN who presents for follow-up.      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 Pertinent positives noted in the HPI.     Studies Reviewed: Marland Kitchen        TTE 11/22/2023 1. Left ventricular ejection fraction, by estimation, is 55%. The left  ventricle has normal function. The left ventricle has no regional wall  motion abnormalities. Left ventricular diastolic parameters are  indeterminate.   2. Right ventricular systolic function is normal. The right ventricular  size is normal. There is normal pulmonary artery systolic pressure.   3. Left atrial size was mildly dilated.   4. Post repair with ring/chords trivial residual MR . The mitral valve  has been repaired/replaced. Trivial mitral valve regurgitation. No  evidence of mitral stenosis. There is a 34 Simulus semi rigid band, gore  tex artificial chords x 2. present in the  mitral position. Procedure Date: 08/17/22.   5. The aortic valve is tricuspid. Aortic valve regurgitation is trivial.  Aortic valve sclerosis is present, with no evidence of aortic valve  stenosis.   6. The inferior vena cava is normal in size with greater than 50%  respiratory variability, suggesting right atrial pressure of 3 mmHg.  Physical Exam:   VS:  There were no vitals taken for this visit.   Wt Readings from Last 3 Encounters:  03/28/24 146 lb (66.2 kg)  02/18/24 145 lb (65.8 kg)   01/24/24 146 lb 1.6 oz (66.3 kg)    GEN: Well nourished, well developed in no acute distress NECK: No JVD; No carotid bruits CARDIAC: ***RRR, no murmurs, rubs, gallops RESPIRATORY:  Clear to auscultation without rales, wheezing or rhonchi  ABDOMEN: Soft, non-tender, non-distended EXTREMITIES:  No edema; No deformity  ASSESSMENT AND PLAN: .   ***    {Are you ordering a CV Procedure (e.g. stress test, cath, DCCV, TEE, etc)?   Press F2        :[id number]}   Follow-up: No follow-ups on file.  Time Spent with Patient: I have spent a total of *** minutes caring for this patient today face to face, ordering and reviewing labs/tests, reviewing prior records/medical history, examining the patient, establishing an assessment and plan, communicating results/findings to the patient/family, and documenting in the medical record.   Signed, Lenna Gilford. Flora Lipps, MD, [facility]  [facility]  [address] [place], [place] [address] [phone]  9:10 AM

## 2024-04-06 ENCOUNTER — Ambulatory Visit: Payer: Medicare HMO | Attending: Cardiovascular Disease | Admitting: Cardiovascular Disease

## 2024-04-06 ENCOUNTER — Encounter: Payer: Self-pay | Admitting: Cardiovascular Disease

## 2024-04-06 VITALS — BP 134/88 | HR 70 | Ht 68.0 in | Wt 145.0 lb

## 2024-04-06 DIAGNOSIS — Z9889 Other specified postprocedural states: Secondary | ICD-10-CM

## 2024-04-06 DIAGNOSIS — Z2989 Encounter for other specified prophylactic measures: Secondary | ICD-10-CM

## 2024-04-06 DIAGNOSIS — I340 Nonrheumatic mitral (valve) insufficiency: Secondary | ICD-10-CM

## 2024-04-06 DIAGNOSIS — I272 Pulmonary hypertension, unspecified: Secondary | ICD-10-CM

## 2024-04-06 NOTE — Patient Instructions (Signed)

## 2024-04-10 DIAGNOSIS — H401112 Primary open-angle glaucoma, right eye, moderate stage: Secondary | ICD-10-CM | POA: Diagnosis not present

## 2024-04-23 ENCOUNTER — Encounter: Payer: Self-pay | Admitting: Certified Registered Nurse Anesthetist

## 2024-04-25 ENCOUNTER — Encounter: Payer: Self-pay | Admitting: Internal Medicine

## 2024-04-26 ENCOUNTER — Other Ambulatory Visit: Payer: Self-pay | Admitting: Internal Medicine

## 2024-04-26 ENCOUNTER — Ambulatory Visit: Admitting: Internal Medicine

## 2024-04-26 ENCOUNTER — Encounter: Payer: Self-pay | Admitting: Internal Medicine

## 2024-04-26 VITALS — BP 145/95 | HR 63 | Temp 98.3°F | Resp 20 | Ht 68.0 in | Wt 146.0 lb

## 2024-04-26 DIAGNOSIS — R948 Abnormal results of function studies of other organs and systems: Secondary | ICD-10-CM

## 2024-04-26 DIAGNOSIS — K3189 Other diseases of stomach and duodenum: Secondary | ICD-10-CM | POA: Diagnosis not present

## 2024-04-26 DIAGNOSIS — K8681 Exocrine pancreatic insufficiency: Secondary | ICD-10-CM

## 2024-04-26 DIAGNOSIS — K2970 Gastritis, unspecified, without bleeding: Secondary | ICD-10-CM | POA: Diagnosis not present

## 2024-04-26 DIAGNOSIS — K208 Other esophagitis without bleeding: Secondary | ICD-10-CM

## 2024-04-26 DIAGNOSIS — K449 Diaphragmatic hernia without obstruction or gangrene: Secondary | ICD-10-CM | POA: Diagnosis not present

## 2024-04-26 DIAGNOSIS — K259 Gastric ulcer, unspecified as acute or chronic, without hemorrhage or perforation: Secondary | ICD-10-CM | POA: Diagnosis not present

## 2024-04-26 DIAGNOSIS — K2950 Unspecified chronic gastritis without bleeding: Secondary | ICD-10-CM

## 2024-04-26 DIAGNOSIS — G1221 Amyotrophic lateral sclerosis: Secondary | ICD-10-CM | POA: Diagnosis not present

## 2024-04-26 DIAGNOSIS — K900 Celiac disease: Secondary | ICD-10-CM | POA: Diagnosis not present

## 2024-04-26 DIAGNOSIS — K222 Esophageal obstruction: Secondary | ICD-10-CM

## 2024-04-26 DIAGNOSIS — K315 Obstruction of duodenum: Secondary | ICD-10-CM

## 2024-04-26 DIAGNOSIS — K2210 Ulcer of esophagus without bleeding: Secondary | ICD-10-CM | POA: Diagnosis not present

## 2024-04-26 MED ORDER — PANTOPRAZOLE SODIUM 40 MG PO TBEC: 40.0000 mg | DELAYED_RELEASE_TABLET | Freq: Every day | ORAL | 3 refills | Status: AC

## 2024-04-26 MED ORDER — SODIUM CHLORIDE 0.9 % IV SOLN: 500.0000 mL | Freq: Once | INTRAVENOUS | Status: DC

## 2024-04-26 NOTE — Progress Notes (Signed)
 Patient to call and schedule follow-up after checking her schedule, B.Chealsea Paske RN.

## 2024-04-26 NOTE — Progress Notes (Signed)
1454 Robinul 0.1 mg IV given due large amount of secretions upon assessment.  MD made aware, vss 

## 2024-04-26 NOTE — Progress Notes (Signed)
 Vitals-DT  Pt's states no medical or surgical changes since previsit or office visit.

## 2024-04-26 NOTE — Patient Instructions (Signed)
 Please read handouts provided. Await pathology results. Avoid NSAIDs. Start pantoprazole 40 mg daily. Return to GI clinic in 2-3 months.   YOU HAD AN ENDOSCOPIC PROCEDURE TODAY AT THE [facility]:   Refer to the procedure report that was given to you for any specific questions about what was found during the examination.  If the procedure report does not answer your questions, please call your gastroenterologist to clarify.  If you requested that your care partner not be given the details of your procedure findings, then the procedure report has been included in a sealed envelope for you to review at your convenience later.  YOU SHOULD EXPECT: Some feelings of bloating in the abdomen. Passage of more gas than usual.  Walking can help get rid of the air that was put into your GI tract during the procedure and reduce the bloating. If you had a lower endoscopy (such as a colonoscopy or flexible sigmoidoscopy) you may notice spotting of blood in your stool or on the toilet paper. If you underwent a bowel prep for your procedure, you may not have a normal bowel movement for a few days.  Please Note:  You might notice some irritation and congestion in your nose or some drainage.  This is from the oxygen  used during your procedure.  There is no need for concern and it should clear up in a day or so.  SYMPTOMS TO REPORT IMMEDIATELY:  Following upper endoscopy (EGD)  Vomiting of blood or coffee ground material  New chest pain or pain under the shoulder blades  Painful or persistently difficult swallowing  New shortness of breath  Fever of 100F or higher  Black, tarry-looking stools  For urgent or emergent issues, a gastroenterologist can be reached at any hour by calling [phone]. Do not use MyChart messaging for urgent concerns.    DIET:  We do recommend a small meal at first, but then you may proceed to your regular diet.  Drink plenty of fluids but you should avoid alcoholic  beverages for 24 hours.  ACTIVITY:  You should plan to take it easy for the rest of today and you should NOT DRIVE or use heavy machinery until tomorrow (because of the sedation medicines used during the test).    FOLLOW UP: Our staff will call the number listed on your records the next business day following your procedure.  We will call around 7:15- 8:00 am to check on you and address any questions or concerns that you may have regarding the information given to you following your procedure. If we do not reach you, we will leave a message.     If any biopsies were taken you will be contacted by phone or by letter within the next 1-3 weeks.  Please call us  at [phone] if you have not heard about the biopsies in 3 weeks.    SIGNATURES/CONFIDENTIALITY: You and/or your care partner have signed paperwork which will be entered into your electronic medical record.  These signatures attest to the fact that that the information above on your After Visit Summary has been reviewed and is understood.  Full responsibility of the confidentiality of this discharge information lies with you and/or your care-partner.

## 2024-04-26 NOTE — Op Note (Signed)
 [facility] Patient Name: Karen Villanueva Procedure Date: 04/26/2024 2:25 PM MRN: [id number] Endoscopist: Freada Jacobs [place] , , [id number] Age: 68 Referring MD:  Date of Birth: [birth year] Gender: Female Account #: [id number] Procedure:                Upper GI endoscopy Indications:              Abnormal PET scan of the GI tract, Follow-up of                            celiac disease Medicines:                Monitored Anesthesia Care Procedure:                Pre-Anesthesia Assessment:                           - Prior to the procedure, a History and Physical                            was performed, and patient medications and                            allergies were reviewed. The patient's tolerance of                            previous anesthesia was also reviewed. The risks                            and benefits of the procedure and the sedation                            options and risks were discussed with the patient.                            All questions were answered, and informed consent                            was obtained. Prior Anticoagulants: The patient has                            taken no anticoagulant or antiplatelet agents. ASA                            Grade Assessment: II - A patient with mild systemic                            disease. After reviewing the risks and benefits,                            the patient was deemed in satisfactory condition to                            undergo the procedure.  After obtaining informed consent, the endoscope was                            passed under direct vision. Throughout the                            procedure, the patient's blood pressure, pulse, and                            oxygen  saturations were monitored continuously. The                            GIF [id number] #[id number] was introduced through the                            mouth, and advanced to the second part  of duodenum.                            The upper GI endoscopy was accomplished without                            difficulty. The patient tolerated the procedure                            well. Scope In: Scope Out: Findings:                 One benign-appearing, intrinsic moderate                            (circumferential scarring or stenosis; an endoscope                            may pass) stenosis was found at the                            gastroesophageal junction. This stenosis measured                            less than one cm (in length). The stenosis was                            traversed. Biopsies were taken with a cold forceps                            for histology.                           A 2 cm hiatal hernia was present.                           Multiple localized erosions with no bleeding and no                            stigmata of recent bleeding were found in the  gastric antrum.                           Localized nodular mucosa was found in the duodenal                            bulb and in the second portion of the duodenum.                            Biopsies were taken with a cold forceps for                            histology.                           Three duodenal stenoses were found in the second                            portion of the duodenum and was traversed. These                            were biopsied along with the nodular mucosa in the                            duodenum. Complications:            No immediate complications. Estimated Blood Loss:     Estimated blood loss was minimal. Impression:               - Benign-appearing esophageal stenosis. Biopsied.                           - 2 cm hiatal hernia.                           - Erosive gastropathy with no bleeding and no                            stigmata of recent bleeding.                           - Nodular mucosa in the duodenal bulb and in the                             second portion of the duodenum. Biopsied.                           - Acquired duodenal stenoses. Recommendation:           - Discharge patient to home (with escort).                           - Await pathology results.                           - Avoid NSAIDs.                           -  Start pantoprazole 40 mg daily.                           - Return to GI clinic in 2-3 months.                           - The findings and recommendations were discussed                            with the patient. Dr Pedro Bourgeois "Karen Villanueva" Karen Villanueva,  04/26/2024 3:19:12 PM

## 2024-04-26 NOTE — Progress Notes (Signed)
 Pt's states no medical or surgical changes since previsit or office visit.

## 2024-04-26 NOTE — Progress Notes (Signed)
 Report given to PACU, vss

## 2024-04-26 NOTE — Progress Notes (Signed)
 GASTROENTEROLOGY PROCEDURE H&P NOTE   Primary Care Physician: Chyrel Craw, NP    Reason for Procedure:   Celiac disease, hypermetabolism in the second portion of the duodenum on PET scan  Plan:    EGD  Patient is appropriate for endoscopic procedure(s) in the ambulatory ([facility]) setting.  The nature of the procedure, as well as the risks, benefits, and alternatives were carefully and thoroughly reviewed with the patient. Ample time for discussion and questions allowed. The patient understood, was satisfied, and agreed to proceed.     HPI: Karen Villanueva is a 68 y.o. female who presents for EGD for evaluation of celiac disease, hypermetabolism in the second portion of the small bowel on PET scan.  Patient was most recently seen in the Gastroenterology Clinic on 03/28/24.  No interval change in medical history since that appointment. Please refer to that note for full details regarding GI history and clinical presentation.   Past Medical History:  Diagnosis Date   ALS (amyotrophic lateral sclerosis) (HCC)    Celiac disease    Closed fracture of left distal radius    Left ulnar fracture    left   Osteoporosis     Past Surgical History:  Procedure Laterality Date   ABDOMINAL HYSTERECTOMY  1994   has one ovary left   BUBBLE STUDY  07/06/2022   Procedure: BUBBLE STUDY;  Surgeon: Harrold Lincoln, MD;  Location: [facility];  Service: Cardiovascular;;   FEMUR FRACTURE SURGERY Right    OPEN REDUCTION INTERNAL FIXATION (ORIF) DISTAL RADIAL FRACTURE Left 10/31/2019   Procedure: OPEN REDUCTION INTERNAL FIXATION (ORIF) LEFT DISTAL RADIAL FRACTURE,  CLOSED REDUCTION LEFT ULNA;  Surgeon: Brunilda Capra, MD;  Location: [facility];  Service: Orthopedics;  Laterality: Left;   RIGHT/LEFT HEART CATH AND CORONARY ANGIOGRAPHY N/A 07/06/2022   Procedure: RIGHT/LEFT HEART CATH AND CORONARY ANGIOGRAPHY;  Surgeon: Odie Benne, MD;  Location: [facility] INVASIVE CV LAB;   Service: Cardiovascular;  Laterality: N/A;   TEE WITHOUT CARDIOVERSION N/A 07/06/2022   Procedure: TRANSESOPHAGEAL ECHOCARDIOGRAM (TEE);  Surgeon: Harrold Lincoln, MD;  Location: [facility];  Service: Cardiovascular;  Laterality: N/A;    Prior to Admission medications   Medication Sig Start Date End Date Taking? Authorizing Provider  amoxicillin  (AMOXIL ) 500 MG tablet TAKE 4 TABLETS BY MOUTH 30-60 MINS PRIOR TO DENTAL WORK 09/23/23   O'Neal, Cathay Clonts, MD  Ascorbic Acid (VITAMIN C PO) Take by mouth. daily    [provider]  aspirin  EC 81 MG tablet Take 1 tablet (81 mg total) by mouth daily. Swallow whole. 12/08/22   O'NealCathay Clonts, MD  diphenhydramine-acetaminophen  (TYLENOL  PM) 25-500 MG TABS tablet Take 2 tablets by mouth at bedtime as needed (sleep).    [provider]  rosuvastatin (CRESTOR) 5 MG tablet Take 5 mg by mouth daily. 03/02/24   [provider]  Tafluprost, PF, (ZIOPTAN) 0.0015 % SOLN Place 1 drop into both eyes at bedtime.    [provider]  VITAMIN D  PO Take by mouth. 2 every AM    [provider]    Current Outpatient Medications  Medication Sig Dispense Refill   Ascorbic Acid (VITAMIN C PO) Take by mouth. daily     aspirin  EC 81 MG tablet Take 1 tablet (81 mg total) by mouth daily. Swallow whole. 90 tablet 3   diphenhydramine-acetaminophen  (TYLENOL  PM) 25-500 MG TABS tablet Take 2 tablets by mouth at bedtime as needed (sleep).     rosuvastatin (CRESTOR)  5 MG tablet Take 5 mg by mouth daily.     Tafluprost, PF, (ZIOPTAN) 0.0015 % SOLN Place 1 drop into both eyes at bedtime.     VITAMIN D  PO Take by mouth. 2 every AM     amoxicillin  (AMOXIL ) 500 MG tablet TAKE 4 TABLETS BY MOUTH 30-60 MINS PRIOR TO DENTAL WORK 4 tablet 3   Current Facility-Administered Medications  Medication Dose Route Frequency Provider Last Rate Last Admin   0.9 %  sodium chloride  infusion  500 mL Intravenous Once Daina Drum, MD         Allergies as of 04/26/2024 - Review Complete 04/26/2024  Allergen Reaction Noted   Diclofenac Other (See Comments) 02/18/2024   Wheat Diarrhea 03/20/2013    Family History  Problem Relation Age of Onset   Heart failure Mother    Diabetes Father    Colon cancer Neg Hx    Esophageal cancer Neg Hx     Social History   Socioeconomic History   Marital status: Married    Spouse name: Not on file   Number of children: 0   Years of education: Not on file   Highest education level: Not on file  Occupational History   Occupation: Part time Conservation officer, [employer]  Tobacco Use   Smoking status: Former    Types: Cigarettes   Smokeless tobacco: Never  Vaping Use   Vaping status: Never Used  Substance and Sexual Activity   Alcohol use: Yes    Comment: occ.   Drug use: Never   Sexual activity: Not on file  Other Topics Concern   Not on file  Social History Narrative   Not on file   Social Drivers of Health   Financial Resource Strain: Not on file  Food Insecurity: No Food Insecurity (01/24/2024)   Hunger Vital Sign    Worried About Running Out of Food in the Last Year: Never true    Ran Out of Food in the Last Year: Never true  Transportation Needs: No Transportation Needs (01/24/2024)   PRAPARE - Administrator, Civil Service (Medical): No    Lack of Transportation (Non-Medical): No  Physical Activity: Not on file  Stress: Not on file  Social Connections: Not on file  Intimate Partner Violence: Not At Risk (01/24/2024)   Humiliation, Afraid, Rape, and Kick questionnaire    Fear of Current or Ex-Partner: No    Emotionally Abused: No    Physically Abused: No    Sexually Abused: No    Physical Exam: Vital signs in last 24 hours: BP (!) 144/103 (BP Location: Right Arm, Patient Position: Sitting, Cuff Size: Normal) Comment: very nervous/ states that her dr didn't think the bp was that bad.  Pulse 75   Temp 98.3 F (36.8 C) (Temporal)   Ht 5\' 8"  (1.727 m)   Wt 146  lb (66.2 kg)   SpO2 100%   BMI 22.20 kg/m  GEN: NAD EYE: Sclerae anicteric ENT: MMM CV: Non-tachycardic Pulm: No increased WOB GI: Soft NEURO:  Alert & Oriented   Regino Caprio, MD [facility]   04/26/2024 2:34 PM

## 2024-04-27 ENCOUNTER — Telehealth: Payer: Self-pay | Admitting: *Deleted

## 2024-04-27 NOTE — Telephone Encounter (Signed)
  Follow up Call-     04/26/2024    2:18 PM 04/26/2024    2:13 PM  Call back number  Post procedure Call Back phone  # [phone]   Permission to leave phone message  Yes     Patient questions:  Do you have a fever, pain , or abdominal swelling? No. Pain Score  0 *  Have you tolerated food without any problems? Yes.    Have you been able to return to your normal activities? Yes.    Do you have any questions about your discharge instructions: Diet   No. Medications  No. Follow up visit  No.  Do you have questions or concerns about your Care? No.  Actions: * If pain score is 4 or above: No action needed, pain <4.

## 2024-05-01 LAB — SURGICAL PATHOLOGY

## 2024-05-02 NOTE — Telephone Encounter (Signed)
 Pt questioned the results of the recent procedure. Pt was notified as soon as the provider reviews the results then we will reach out to the pt and make her aware. Pt verbalized understanding with all questions answered.

## 2024-05-07 ENCOUNTER — Encounter: Payer: Self-pay | Admitting: Internal Medicine

## 2024-05-30 DIAGNOSIS — R030 Elevated blood-pressure reading, without diagnosis of hypertension: Secondary | ICD-10-CM | POA: Diagnosis not present

## 2024-05-30 DIAGNOSIS — Z0000 Encounter for general adult medical examination without abnormal findings: Secondary | ICD-10-CM | POA: Diagnosis not present

## 2024-05-30 DIAGNOSIS — M810 Age-related osteoporosis without current pathological fracture: Secondary | ICD-10-CM | POA: Diagnosis not present

## 2024-05-30 DIAGNOSIS — Z9889 Other specified postprocedural states: Secondary | ICD-10-CM | POA: Diagnosis not present

## 2024-05-30 DIAGNOSIS — I272 Pulmonary hypertension, unspecified: Secondary | ICD-10-CM | POA: Diagnosis not present

## 2024-05-30 DIAGNOSIS — M25561 Pain in right knee: Secondary | ICD-10-CM | POA: Diagnosis not present

## 2024-05-30 DIAGNOSIS — G1221 Amyotrophic lateral sclerosis: Secondary | ICD-10-CM | POA: Diagnosis not present

## 2024-05-30 DIAGNOSIS — E785 Hyperlipidemia, unspecified: Secondary | ICD-10-CM | POA: Diagnosis not present

## 2024-05-30 DIAGNOSIS — M545 Low back pain, unspecified: Secondary | ICD-10-CM | POA: Diagnosis not present

## 2024-06-09 ENCOUNTER — Ambulatory Visit: Admitting: Internal Medicine

## 2024-06-09 ENCOUNTER — Other Ambulatory Visit (INDEPENDENT_AMBULATORY_CARE_PROVIDER_SITE_OTHER)

## 2024-06-09 ENCOUNTER — Encounter: Payer: Self-pay | Admitting: Internal Medicine

## 2024-06-09 VITALS — BP 124/88 | HR 72 | Ht 65.75 in | Wt 145.0 lb

## 2024-06-09 DIAGNOSIS — R197 Diarrhea, unspecified: Secondary | ICD-10-CM

## 2024-06-09 DIAGNOSIS — D649 Anemia, unspecified: Secondary | ICD-10-CM

## 2024-06-09 DIAGNOSIS — K8681 Exocrine pancreatic insufficiency: Secondary | ICD-10-CM

## 2024-06-09 DIAGNOSIS — K900 Celiac disease: Secondary | ICD-10-CM | POA: Diagnosis not present

## 2024-06-09 DIAGNOSIS — K449 Diaphragmatic hernia without obstruction or gangrene: Secondary | ICD-10-CM | POA: Diagnosis not present

## 2024-06-09 DIAGNOSIS — Z8719 Personal history of other diseases of the digestive system: Secondary | ICD-10-CM | POA: Diagnosis not present

## 2024-06-09 LAB — VITAMIN B12: Vitamin B-12: 244 pg/mL (ref 211–911)

## 2024-06-09 LAB — CBC WITH DIFFERENTIAL/PLATELET
Basophils Absolute: 0.1 10*3/uL (ref 0.0–0.1)
Basophils Relative: 1.1 % (ref 0.0–3.0)
Eosinophils Absolute: 0.1 10*3/uL (ref 0.0–0.7)
Eosinophils Relative: 1.4 % (ref 0.0–5.0)
HCT: 33.7 % — ABNORMAL LOW (ref 36.0–46.0)
Hemoglobin: 10.6 g/dL — ABNORMAL LOW (ref 12.0–15.0)
Lymphocytes Relative: 23.8 % (ref 12.0–46.0)
Lymphs Abs: 2.2 10*3/uL (ref 0.7–4.0)
MCHC: 31.4 g/dL (ref 30.0–36.0)
MCV: 70.4 fl — ABNORMAL LOW (ref 78.0–100.0)
Monocytes Absolute: 0.8 10*3/uL (ref 0.1–1.0)
Monocytes Relative: 8.3 % (ref 3.0–12.0)
Neutro Abs: 6.2 10*3/uL (ref 1.4–7.7)
Neutrophils Relative %: 65.4 % (ref 43.0–77.0)
Platelets: 404 10*3/uL — ABNORMAL HIGH (ref 150.0–400.0)
RBC: 4.79 Mil/uL (ref 3.87–5.11)
RDW: 17.2 % — ABNORMAL HIGH (ref 11.5–15.5)
WBC: 9.4 10*3/uL (ref 4.0–10.5)

## 2024-06-09 LAB — IBC + FERRITIN
Ferritin: 3.3 ng/mL — ABNORMAL LOW (ref 10.0–291.0)
Iron: 18 ug/dL — ABNORMAL LOW (ref 42–145)
Saturation Ratios: 3.5 % — ABNORMAL LOW (ref 20.0–50.0)
TIBC: 508.2 ug/dL — ABNORMAL HIGH (ref 250.0–450.0)
Transferrin: 363 mg/dL — ABNORMAL HIGH (ref 212.0–360.0)

## 2024-06-09 LAB — HEPATIC FUNCTION PANEL
ALT: 7 U/L (ref 0–35)
AST: 11 U/L (ref 0–37)
Albumin: 4.4 g/dL (ref 3.5–5.2)
Alkaline Phosphatase: 83 U/L (ref 39–117)
Bilirubin, Direct: 0.1 mg/dL (ref 0.0–0.3)
Total Bilirubin: 0.4 mg/dL (ref 0.2–1.2)
Total Protein: 7.4 g/dL (ref 6.0–8.3)

## 2024-06-09 LAB — VITAMIN D 25 HYDROXY (VIT D DEFICIENCY, FRACTURES): VITD: 33.92 ng/mL (ref 30.00–100.00)

## 2024-06-09 LAB — FOLATE: Folate: 2.6 ng/mL — ABNORMAL LOW (ref >5.9)

## 2024-06-09 LAB — TSH: TSH: 1.81 u[IU]/mL (ref 0.35–5.50)

## 2024-06-09 NOTE — Patient Instructions (Addendum)
 _______________________________________________________  If your blood pressure at your visit was 140/90 or greater, please contact your primary care physician to follow up on this.  _______________________________________________________  If you are age 69 or older, your body mass index should be between 23-30. Your Body mass index is 23.58 kg/m. If this is out of the aforementioned range listed, please consider follow up with your Primary Care Provider.  If you are age 83 or younger, your body mass index should be between 19-25. Your Body mass index is 23.58 kg/m. If this is out of the aformentioned range listed, please consider follow up with your Primary Care Provider.   ________________________________________________________  The [place] [facility] providers would like to encourage you to use MYCHART to communicate with providers for non-urgent requests or questions.  Due to long hold times on the telephone, sending your provider a message by [facility] may be a faster and more efficient way to get a response.  Please allow 48 business hours for a response.  Please remember that this is for non-urgent requests.  _______________________________________________________  Your provider has requested that you go to the basement level for lab work before leaving today. Press B on the elevator. The lab is located at the first door on the left as you exit the elevator.  Stop Protonix   A referral has been sent to [facility] nutrition and diabetes Education services. Please call them in 2 weeks if you haven't heard anything Please follow up in 3 months. Give us  a call at [phone] to schedule an appointment.   Gluten-Free Diet for Celiac Disease, Adult  The gluten-free diet includes all foods that do not contain gluten. Gluten is a protein that is found in wheat, rye, barley, and some other grains. Following the gluten-free diet is the only treatment for people with celiac disease. It helps to  prevent damage to the intestines and improves or eliminates the symptoms of celiac disease. Following the gluten-free diet requires some planning. It can be challenging at first, but it gets easier with time and practice. There are more gluten-free options available today than ever before. If you need help finding gluten-free foods or if you have questions, talk with your dietitian or health care provider. What are tips for following this plan? Reading food labels Read all food labels. Gluten is often added to foods. Always check the ingredient list and look for warnings that the food may contain gluten. Foods that list any of these key words on the label usually contain gluten: Wheat, flour, enriched flour, bromated flour, white flour, durum flour, graham flour, phosphated flour, self-rising flour, semolina, farina, barley (malt), rye, and oats. Starch, dextrin, modified food starch, or cereal. Thickening, fillers, or emulsifiers. Malt flavoring, malt extract, or malt syrup. Hydrolyzed vegetable protein. In the [place] packaged foods that are gluten-free are required to be labeled GF. These foods should be easy to identify and are safe to eat. In the [place] food companies are also required to list common food allergens, including wheat, on their labels. Shopping When grocery shopping, start in the produce, meat, and dairy sections. These areas are more likely to contain gluten-free foods. Then move to the aisles that contain packaged foods if you need to. Meal planning All fruits, vegetables, and meats are safe to eat and do not contain gluten. Talk with your dietitian or health care provider before taking a gluten-free multivitamin or mineral supplement. Be aware of gluten-free foods having contact with foods that contain gluten (cross-contamination). This can happen  at home and with any processed foods. Talk with your health care provider or dietitian about how to reduce the risk of  cross-contamination in your home. If you have questions about how a food is processed, ask the manufacturer. What foods can I eat? Fruits All plain fresh, frozen, canned, and dried fruits, and 100% fruit juices. Vegetables All plain fresh, frozen, and canned vegetables, and 100% vegetable juices. Grains Amaranth, bean flours, 100% buckwheat flour, corn, millet, nut flours or nut meals, GF oats, quinoa, rice, sorghum, teff, rice wafers, pure cornmeal tortillas, popcorn, and hot cereals made from cornmeal or GF grains. Hominy, rice, and wild rice. Some Asian rice noodles or bean noodles. Arrowroot starch, corn bran, corn flour, corn germ, cornmeal, corn starch, potato flour, potato starch flour, and rice bran. Plain, brown, and sweet rice flours. Rice polish, soy flour, and tapioca starch. Meats and other protein foods All fresh beef, pork, poultry, fish, seafood, and eggs. Fish canned in water, oil, brine, or vegetable broth. Plain nuts and seeds, peanut butter.  Some precooked or cured meat, such as sausages or meat loaves. Some frankfurters. Dried beans, dried peas, and lentils. Dairy Fresh plain, dry, evaporated, or condensed milk. Cream, butter, sour cream, whipping cream, and most yogurts. Unprocessed cheese, most processed cheeses, some cottage cheeses, and some cream cheeses. Beverages Coffee, tea, and most herbal teas. Carbonated beverages and some root beers. Wine, sake, and pure distilled spirits, such as gin, vodka, and whiskey. Most hard ciders. Fats and oils Butter, margarine, vegetable oil, hydrogenated butter, olive oil, shortening, lard, cream, and some mayonnaise. Some commercial salad dressings. Olives. Sweets and desserts Sugar, honey, some syrups, molasses, jelly, and jam. Plain hard candy, marshmallows, and gumdrops.  Pure cocoa powder. Plain chocolate. Custard and some pudding mixes. Gelatin desserts, sorbets, frozen ice pops, and sherbet.  Cake, cookies, and other  desserts prepared with allowed flours. Some commercial ice creams. Cornstarch, tapioca, and rice puddings. Seasoning and other foods Some canned or frozen soups. Monosodium glutamate (MSG). Cider, rice, and wine vinegar. Baking soda and baking powder.  Cream of tartar. Baking and nutritional yeast. Certain soy sauces made without wheat. Ask your dietitian about specific brands that are allowed. Nuts, coconut, and chocolate. Salt, pepper, herbs, spices, flavoring extracts, imitation or artificial flavorings, natural flavorings, and food colorings.  Some medicines and supplements. Rice syrups. The items listed above may not be a complete list of foods and beverages you can eat and drink. Contact a dietitian for more information. What foods should I avoid? Fruits Thickened or prepared fruits and some pie fillings. Some fruit snacks and fruit roll-ups. Vegetables Most creamed vegetables and most vegetables canned in sauces. Some commercially prepared vegetables and salads. Vegetables in a soy sauce marinade or dressing. Grains Barley, bran, bulgur, couscous, cracked wheat, Black [place], farro, graham, malt, matzo, semolina, wheat germ, and all wheat and rye cereals, including spelt and kamut. Cereals containing malt as a flavoring, such as rice cereal. Noodles, spaghetti, macaroni, most packaged rice mixes, and all mixes containing wheat, rye, barley, or triticale. Meats and other protein foods Any meat or meat alternative containing wheat, rye, barley, or gluten stabilizers. These are often marinated or packaged meats, and precooked or cured meat, such as sausages or meat loaves. Bread-containing products, such as Swiss steak, croquettes, meatballs, and meatloaf. Most tuna canned in vegetable broth. [place] with hydrolyzed vegetable protein (HVP) injected as part of the basting. Seitan. Imitation fish. Eggs in sauces made from ingredients to avoid. Dairy Commercial  chocolate milk drinks and malted milk.  Some non-dairy creamers. Any cheese product containing ingredients to avoid. Beverages Certain cereal beverages. Beer, ale, malted milk, and some root beers. Some hard ciders. Some instant flavored coffees. Some herbal teas made with barley or with barley malt added. Fats and oils Some commercial salad dressings. Sour cream containing modified food starch. Sweets and desserts Some toffees. Chocolate-coated nuts (may be rolled in wheat flour) and some commercial candies and candy bars.  Most cakes, cookies, donuts, pastries, and other baked goods. Some commercial ice cream. Ice cream cones.  Commercially prepared mixes for cakes, cookies, and other desserts. Bread pudding and other puddings thickened with flour.  Products containing brown rice syrup made with barley malt enzyme. Desserts and sweets made with malt flavoring. Seasoning and other foods Some curry powders, some dry seasoning mixes, some gravy extracts, some meat sauces, some ketchups, some prepared mustards, and horseradish.  Certain soy sauces. Malt vinegar. Bouillon and bouillon cubes that contain HVP. Some chip dips. Some chewing gum. Yeast extract. Brewer's yeast. Caramel color.  Some medicines and supplements. The items listed above may not be a complete list of foods and beverages you should avoid. Contact a dietitian for more information. Summary Gluten is a protein that is found in wheat, rye, barley, and some other grains. The gluten-free diet includes all foods that do not contain gluten. If you need help finding gluten-free foods or if you have questions, talk with your dietitian or your health care provider. Read all food labels. Gluten is often added to foods. Always check the ingredient list and look for warnings that the food may contain gluten. This information is not intended to replace advice given to you by your health care provider. Make sure you discuss any questions you have with your health care  provider. Document Revised: 11/04/2021 Document Reviewed: 11/04/2021 Elsevier Patient Education  2024 [employer].

## 2024-06-09 NOTE — Progress Notes (Signed)
 Chief Complaint: Celiac disease  HPI:  Patient is a 68 year old female/female patient with past medical history of nonrheumatic mitral valve regurgitation, celiac disease, amyotrophic lateral sclerosis, osteoporosis, dysarthria, bilateral arm and leg weakness, hyperlipidemia, venous stasis presents for follow up of celiac disease  01/24/24 presented to the [facility] for consultation regarding pathologic vs compression fracture. Possible  etiologies including metastatic disease, multiple myeloma, worsening osteoporosis. Labs were not consistent with multiple myeloma as kappa lambda light chain ration was normal and there was no M protein observed in multiple myeloma panel. Breast tumor markers are negative.   01/27/24 PET scan IMPRESSION: 1. Very mild hypermetabolism associated with a T12 compression fracture. Otherwise, no evidence of multiple myeloma or other primary malignancy. 2. Hypermetabolic asymmetric right oropharyngeal soft tissue. Malignancy cannot be excluded. This should be amenable to direct inspection. 3. Hypermetabolism associated with a slightly thickened descending duodenum. This area is under distended, limiting further evaluation. Consider CT enterography in further evaluation, as clinically indicated. If a more aggressive approach is desired, upper endoscopy could be considered. 4.  Aortic atherosclerosis (ICD10-I70.0).  02/08/24 per Oncologist Dr. Jacquelyn Matt Patient also has nonspecific hypermetabolism and thickening in the descending duodenum.  This is in the context of known celiac disease.  Would refer the patient to GI for consideration of EGD for evaluation of this area to determine if this is active celiac disease or gluten enteropathy associated with low-grade lymphoma or other etiologies.  02/18/24 ENT flexible fiberoptic laryngoscopy-The nasal cavity was patent without rhinorrhea or polyp. The nasopharynx was also patent without mass or lesion. The base of  tongue was visualized and was normal. There were no signs of pooling of secretions in the piriform sinuses. The true vocal folds were mobile bilaterally. There were no signs of glottic or supraglottic mucosal lesion or mass. There was moderate interarytenoid pachydermia and post cricoid edema.   Discussed serial imaging vs biopsy. Patient prefers to avoid biopsy and I think it is reasonable based on clinical picture.   Oncology is on board with the plan, care discussed with Dr Salomon Cree, Oncology. Plan to repeat imaging in six months to monitor for changes.   Interval History: She is still having diarrhea, which the patient states it longstanding. She states that she has been following a strict gluten free diet. She is not sure how she could be inadvertently ingesting gluten. She does eat out on occasion but only eats salads when she goes out. She has been taking the pantoprazole  but she has not noticed a difference. Denies chest burning or regurgitation. Denies ab pain. Denies nausea or vomiting. Denies blood in the stools. Denies dysphagia. Endorses fatigue. She only takes baby aspirin  and denies NSAID use.   Wt Readings from Last 3 Encounters:  06/09/24 145 lb (65.8 kg)  04/26/24 146 lb (66.2 kg)  04/06/24 145 lb (65.8 kg)    Past Medical History:  Diagnosis Date   ALS (amyotrophic lateral sclerosis) (HCC)    Celiac disease    Closed fracture of left distal radius    Left ulnar fracture    left   Osteoporosis    Past Surgical History:  Procedure Laterality Date   ABDOMINAL HYSTERECTOMY  1994   has one ovary left   BUBBLE STUDY  07/06/2022   Procedure: BUBBLE STUDY;  Surgeon: Harrold Lincoln, MD;  Location: [facility] ENDOSCOPY;  Service: Cardiovascular;;   FEMUR FRACTURE SURGERY Right    OPEN REDUCTION INTERNAL FIXATION (ORIF) DISTAL RADIAL FRACTURE  Left 10/31/2019   Procedure: OPEN REDUCTION INTERNAL FIXATION (ORIF) LEFT DISTAL RADIAL FRACTURE,  CLOSED REDUCTION LEFT ULNA;  Surgeon: Brunilda Capra, MD;  Location: [facility];  Service: Orthopedics;  Laterality: Left;   RIGHT/LEFT HEART CATH AND CORONARY ANGIOGRAPHY N/A 07/06/2022   Procedure: RIGHT/LEFT HEART CATH AND CORONARY ANGIOGRAPHY;  Surgeon: Odie Benne, MD;  Location: [facility] INVASIVE CV LAB;  Service: Cardiovascular;  Laterality: N/A;   TEE WITHOUT CARDIOVERSION N/A 07/06/2022   Procedure: TRANSESOPHAGEAL ECHOCARDIOGRAM (TEE);  Surgeon: Harrold Lincoln, MD;  Location: [facility] ENDOSCOPY;  Service: Cardiovascular;  Laterality: N/A;    Current Outpatient Medications  Medication Sig Dispense Refill   acetaminophen  (TYLENOL  8 HOUR) 650 MG CR tablet Take 1,300 mg by mouth as needed.     alendronate  (FOSAMAX ) 70 MG tablet Take 70 mg by mouth once a week.     Ascorbic Acid (VITAMIN C PO) Take by mouth. daily     aspirin  EC 81 MG tablet Take 1 tablet (81 mg total) by mouth daily. Swallow whole. 90 tablet 3   diphenhydramine-acetaminophen  (TYLENOL  PM) 25-500 MG TABS tablet Take 2 tablets by mouth at bedtime as needed (sleep).     pantoprazole  (PROTONIX ) 40 MG tablet Take 1 tablet (40 mg total) by mouth daily. 30 tablet 3   rosuvastatin (CRESTOR) 5 MG tablet Take 5 mg by mouth daily.     Tafluprost, PF, (ZIOPTAN) 0.0015 % SOLN Place 1 drop into both eyes at bedtime.     VITAMIN D  PO Take by mouth. 2 every AM     amoxicillin  (AMOXIL ) 500 MG tablet TAKE 4 TABLETS BY MOUTH 30-60 MINS PRIOR TO DENTAL WORK (Patient not taking: Reported on 06/09/2024) 4 tablet 3   No current facility-administered medications for this visit.    Allergies as of 06/09/2024 - Review Complete 06/09/2024  Allergen Reaction Noted   Diclofenac Other (See Comments) 02/18/2024   Gluten meal  06/09/2024   Wheat Diarrhea 03/20/2013    Family History  Problem Relation Age of Onset   Heart failure Mother    Diabetes Father    Colon cancer Neg Hx    Esophageal cancer Neg Hx      Physical Exam:  Vital signs: BP 124/88 (BP Location:  Left Arm, Patient Position: Sitting, Cuff Size: Large)   Pulse 72 Comment: irregular  Ht 5' 5.75 (1.67 m) Comment: height measured without shoes  Wt 145 lb (65.8 kg)   BMI 23.58 kg/m   Constitutional: Pleasant  female appears to be in NAD, Well developed, Well nourished, alert and cooperative Throat: Oral cavity and pharynx without inflammation, swelling or lesion.  Respiratory: Respirations even and unlabored. Lungs clear to auscultation bilaterally.   No wheezes, crackles, or rhonchi.  Cardiovascular: Normal S1, S2. Regular rate and rhythm. No peripheral edema, cyanosis or pallor.  Gastrointestinal:  Soft, nondistended, nontender. Rectal:  Not performed.  Msk:  Symmetrical without gross deformities. Without edema, no deformity or joint abnormality.  Neurologic:  Alert and  oriented x4;  grossly normal neurologically.  Skin:   Dry and intact without significant lesions or rashes. Psychiatric: Oriented to person, place and time. Demonstrates good judgement and reason without abnormal affect or behaviors.  RELEVANT LABS AND IMAGING: CBC    Latest Ref Rng & Units 03/28/2024   10:24 AM 01/24/2024    2:16 PM 07/06/2022    1:04 PM  CBC  WBC 4.0 - 10.5 K/uL 9.7  7.5    Hemoglobin 12.0 - 15.0  g/dL 16.1  09.6  9.9   Hematocrit 36.0 - 46.0 % 36.3  36.6  29.0   Platelets 150.0 - 400.0 K/uL 428.0  430       CMP     Latest Ref Rng & Units 03/28/2024   10:24 AM 01/24/2024    2:16 PM 07/06/2022    1:04 PM  CMP  Glucose 70 - 99 mg/dL 94  99    BUN 6 - 23 mg/dL 16  18    Creatinine 0.45 - 1.20 mg/dL 4.09  8.11    Sodium 914 - 145 mEq/L 139  138  145   Potassium 3.5 - 5.1 mEq/L 3.8  3.9  3.6   Chloride 96 - 112 mEq/L 104  106    CO2 19 - 32 mEq/L 25  25    Calcium 8.4 - 10.5 mg/dL 78.2  95.6    Total Protein 6.5 - 8.1 g/dL  7.8    Total Bilirubin 0.0 - 1.2 mg/dL  0.4    Alkaline Phos 38 - 126 U/L  95    AST 15 - 41 U/L  14    ALT 0 - 44 U/L  10       Lab Results  Component Value Date    TSH 2.950 06/08/2022   01/24/24 labs show: WBC 7.5, hemoglobin 11.7, platelets 430, BUN 18, creatinine 1.01, AST 14, CA 15-3 11.2, LDH 133, multiple myeloma [place] panel, kappa/lambda light chains  Labs 03/2024: TTG IgA negative. IgA nml. CBC with low Hb of 11.3. CRP nml.  11/22/23 echo-Left ventricular ejection fraction, by estimation, is 55%.  02/23/2018 colonoscopy- normal colonoscopy, repeat 10 years  EGD 04/26/24: - One benign- appearing, intrinsic moderate ( circumferential scarring or stenosis; an endoscope may pass) stenosis was found at the gastroesophageal junction. This stenosis measured less than one cm ( in length) . The stenosis was traversed. Biopsies were taken with a cold forceps for histology. - A 2 cm hiatal hernia was present. - Multiple localized erosions with no bleeding and no stigmata of recent bleeding were found in the gastric antrum. - Localized nodular mucosa was found in the duodenal bulb and in the second portion of the duodenum. Biopsies were taken with a cold forceps for histology. - Three duodenal stenoses were found in the second portion of the duodenum and was traversed. These were biopsied along with the nodular mucosa in the duodenum. Path: 1. Surgical [P], duodenum :      MARKED VILLOUS ATROPHY AND CRYPT HYPERPLASIA WITH INTRAEPITHELIAL LYMPHOCYTOSIS      CONSISTENT WITH CELIAC DISEASE (MARSH 3B/C)      2. Surgical [P], gastric :      CHRONIC GASTRITIS WITH REACTIVE EPITHELIAL CHANGES      HELICOBACTER STAIN NEGATIVE (IHC, ADEQUATE CONTROL)      NEGATIVE FOR INTESTINAL METAPLASIA, DYSPLASIA AND CARCINOMA      3. Surgical [P], esophagus stricture :      EROSIVE ESOPHAGITIS      CHRONIC GASTRITIS WITH REACTIVE EPITHELIAL CHANGES      NEGATIVE FOR INTESTINAL METAPLASIA, DYSPLASIA AND CARCINOMA   Assessment: Celiac disease Diarrhea Anemia Hiatal hernia History of esophageal stricture  Patient presents for follow up of celiac disease. On her last EGD, she  was found to have active uncontrolled celiac disease along with several strictures likely related to celiac disease as well. She did have some gastritis and an esophageal stricture with biopsies that suggested erosive esophagitis due to uncontrolled reflux, but the patient  is asymptomatic from a GERD standpoint. She denies any dysphagia and did not find any benefit from PPI therapy. Since patient already has osteoporosis, will have her stop her PPI therapy for now. If her symptoms worsen in the future, could re-evaluate the need for PPI at that time. Will send her to a nutritions to help with following a gluten free diet since patient thinks that she has not been accidentally ingesting gluten. Will check some of her labs today to evaluate for vitamin deficiencies, especially since the patient has had persistent anemia. Since her celiac disease is still uncontrolled, I suspect this is most likely what is leading to her diarrhea issues.  Plan: - Check CBC, vitamin B12, folate, vitamin D , ferritin/IBC, TSH - Stop pantoprazole  40 mg daily for now - Refer to nutritionist to help with following a strict gluten free diet. I encouraged her to keep a food diary - RTC 3 months  Regino Caprio, MD [facility] Gastroenterology 06/09/2024, 12:27 PM  I spent 35 minutes of time, including in depth chart review, independent review of results as outlined above, communicating results with the patient directly, face-to-face time with the patient, coordinating care, and ordering studies and medications as appropriate, and documentation.

## 2024-06-12 ENCOUNTER — Ambulatory Visit: Payer: Self-pay | Admitting: Internal Medicine

## 2024-06-12 ENCOUNTER — Ambulatory Visit: Payer: Self-pay | Admitting: Physician Assistant

## 2024-06-12 DIAGNOSIS — D509 Iron deficiency anemia, unspecified: Secondary | ICD-10-CM

## 2024-06-12 DIAGNOSIS — K900 Celiac disease: Secondary | ICD-10-CM

## 2024-06-12 DIAGNOSIS — E538 Deficiency of other specified B group vitamins: Secondary | ICD-10-CM

## 2024-06-12 DIAGNOSIS — D649 Anemia, unspecified: Secondary | ICD-10-CM

## 2024-06-12 MED ORDER — FOLIC ACID 1 MG PO TABS: 1.0000 mg | ORAL_TABLET | Freq: Every day | ORAL | 3 refills | Status: AC

## 2024-06-12 MED ORDER — FERROUS SULFATE 325 (65 FE) MG PO TABS: 325.0000 mg | ORAL_TABLET | Freq: Every day | ORAL | 3 refills | Status: AC

## 2024-06-23 DIAGNOSIS — M545 Low back pain, unspecified: Secondary | ICD-10-CM | POA: Diagnosis not present

## 2024-06-23 DIAGNOSIS — M25561 Pain in right knee: Secondary | ICD-10-CM | POA: Diagnosis not present

## 2024-06-29 DIAGNOSIS — D649 Anemia, unspecified: Secondary | ICD-10-CM | POA: Diagnosis not present

## 2024-07-10 DIAGNOSIS — H401112 Primary open-angle glaucoma, right eye, moderate stage: Secondary | ICD-10-CM | POA: Diagnosis not present

## 2024-07-14 ENCOUNTER — Encounter: Payer: Self-pay | Admitting: Advanced Practice Midwife

## 2024-07-14 DIAGNOSIS — S22080D Wedge compression fracture of T11-T12 vertebra, subsequent encounter for fracture with routine healing: Secondary | ICD-10-CM | POA: Diagnosis not present

## 2024-07-14 DIAGNOSIS — M545 Low back pain, unspecified: Secondary | ICD-10-CM | POA: Diagnosis not present

## 2024-07-23 ENCOUNTER — Encounter: Payer: Self-pay | Admitting: Obstetrics and Gynecology

## 2024-07-25 DIAGNOSIS — R030 Elevated blood-pressure reading, without diagnosis of hypertension: Secondary | ICD-10-CM | POA: Diagnosis not present

## 2024-07-25 DIAGNOSIS — R399 Unspecified symptoms and signs involving the genitourinary system: Secondary | ICD-10-CM | POA: Diagnosis not present

## 2024-07-25 DIAGNOSIS — N390 Urinary tract infection, site not specified: Secondary | ICD-10-CM | POA: Diagnosis not present

## 2024-07-28 DIAGNOSIS — M545 Low back pain, unspecified: Secondary | ICD-10-CM | POA: Diagnosis not present

## 2024-07-28 DIAGNOSIS — S22080D Wedge compression fracture of T11-T12 vertebra, subsequent encounter for fracture with routine healing: Secondary | ICD-10-CM | POA: Diagnosis not present

## 2024-08-01 ENCOUNTER — Ambulatory Visit: Admitting: Obstetrics

## 2024-08-01 ENCOUNTER — Ambulatory Visit: Admitting: Obstetrics and Gynecology

## 2024-08-01 ENCOUNTER — Encounter: Payer: Self-pay | Admitting: Obstetrics and Gynecology

## 2024-08-01 VITALS — BP 142/89 | HR 69 | Ht 66.14 in | Wt 145.0 lb

## 2024-08-01 DIAGNOSIS — Z87440 Personal history of urinary (tract) infections: Secondary | ICD-10-CM

## 2024-08-01 DIAGNOSIS — N941 Unspecified dyspareunia: Secondary | ICD-10-CM

## 2024-08-01 DIAGNOSIS — N390 Urinary tract infection, site not specified: Secondary | ICD-10-CM | POA: Diagnosis not present

## 2024-08-01 LAB — POCT URINALYSIS DIP (CLINITEK)
Bilirubin, UA: NEGATIVE
Blood, UA: NEGATIVE
Glucose, UA: NEGATIVE mg/dL
Ketones, POC UA: NEGATIVE mg/dL
Nitrite, UA: NEGATIVE
POC PROTEIN,UA: NEGATIVE
Spec Grav, UA: 1.01 (ref 1.010–1.025)
Urobilinogen, UA: 0.2 U/dL
pH, UA: 5.5 (ref 5.0–8.0)

## 2024-08-01 MED ORDER — ESTRADIOL 0.1 MG/GM VA CREA: TOPICAL_CREAM | VAGINAL | 11 refills | Status: AC

## 2024-08-01 NOTE — Assessment & Plan Note (Signed)
-   For treatment of recurrent urinary tract infections, we discussed management of recurrent UTIs including prophylaxis with a daily low dose antibiotic, transvaginal estrogen therapy, D-mannose, and cranberry supplements.  - Estradiol  cream prescribed. Recommended that she can obtain the supplements over the counter.   - If she has symptoms of a UTI, she can call the office to provide a urine sample for treatment.

## 2024-08-01 NOTE — Patient Instructions (Addendum)
 Start vaginal estrogen therapy nightly for two weeks then 2 times weekly at night for treatment of vaginal atrophy (dryness of the vaginal tissues).  Please let us  know if the prescription is too expensive and we can look for alternative options.   For recurrent urinary tract infections:  Continue vaginal estrogen use twice a week Get D-mannose and cranberry over the counter and take daily If you have symptoms of a UTI, call the office so we can get a urine sample  Vulvovaginal moisturizer Options: Vitamin E oil (pump or capsule) or cream (Gene's Vit E Cream) Coconut oil Silicone-based lubricant for use during intercourse (wet platinum is a brand available at most drugstores) Crisco Consider the ingredients of the product - the fewer the ingredients the better!  Directions for Use: Clean and dry your hands Gently dab the vulvar/vaginal area dry as needed Apply a "pea-sized" amount of the moisturizer onto your fingertip Using you other hand, open the labia  Apply the moisturizer to the vulvar/vaginal tissues Wear loose fitting underwear/clothing if possible following application Use moisturize up to 3 times daily as desired.   Today we talked about ways to manage bladder urgency such as altering your diet to avoid irritative beverages and foods (bladder diet) as well as attempting to decrease stress and other exacerbating factors.   The Most Bothersome Foods* The Least Bothersome Foods*  Coffee - Regular & Decaf Tea - caffeinated Carbonated beverages - cola, non-colas, diet & caffeine-free Alcohols - Beer, Red Wine, White Wine, [address] - Grapefruit, [place], Orange, [employer] - Cranberry, Grapefruit, Orange, Pineapple Vegetables - Tomato & Tomato Products Flavor Enhancers - Hot peppers, Spicy foods, Chili, Horseradish, Vinegar, Monosodium glutamate (MSG) Artificial Sweeteners - NutraSweet, Sweet 'N Low, Equal (sweetener), Saccharin Ethnic foods - [place], [place],  [place] food [employer] - low-fat & whole Fruits - Bananas, Blueberries, Honeydew melon, Pears, Raisins, Watermelon Vegetables - Broccoli, [address] Sprouts, [place], Carrots, Cauliflower, [place], Cucumber, Mushrooms, Peas, Radishes, Squash, Zucchini, White potatoes, Sweet potatoes & yams Poultry - Chicken, Eggs, [place], [employer] - Beef, Diplomatic Services operational officer, Lamb Seafood - Shrimp, [place] fish, Salmon Grains - Oat, Rice Snacks - Pretzels, Popcorn  *Mitch ALF et al. Diet and its role in interstitial cystitis/bladder pain syndrome (IC/BPS) and comorbid conditions. [employer]. [employer] Int. 2012 Jan 11.

## 2024-08-01 NOTE — Progress Notes (Signed)
 New Patient Evaluation and Consultation  Referring Provider: Cristopher Bottcher, NP PCP: Cristopher Bottcher, NP Date of Service: 08/01/2024  SUBJECTIVE Chief Complaint: New Patient (Initial Visit) Karen Villanueva is a 68 y.o. female here today for UTIs and female organ prolapse.)  History of Present Illness: Karen Villanueva is a 68 y.o. White or Caucasian female seen in consultation at the request of NP Bottcher Cristopher for evaluation of recurrent UTI.    Review of records significant for: 03/28/24- 10-49,000 E.Coli (resistant to Ampicillin, gentamicin, bactrim, intermediate to amp/sulbactam, ciprofloxacin, levofloxacin and tobramycin) 01/24/24- insignificant growth 01/11/24- 50- 100,000 E.Coli (resistant to ampicillin, gentamicin, tetracycline, bactrim, intermediate to levofloxacin, tobramycin), with <10,000 mixed urogenital flora   Urinary Symptoms: Leaks urine with with movement to the bathroom only with infections. Does not have symptoms without UTI Leaks 1-2 time(s) per night.  Pad use: 1-2 pads per day.   Patient is bothered by UI symptoms.  Day time voids- every 3-4 hours.  Nocturia: 3 times per night to void. Voiding dysfunction:  empties bladder well.  Patient does not use a catheter to empty bladder.  When urinating, patient feels she has no difficulties Drinks: 2 cups coffee, 1 bottle water, 2 glasses tea per day, drinks diet dr pepper when she goes to work.   UTIs: 3 UTI's in the last year.  2 since January.  Reports bladder contractions along with urgency and frequency, denies burning with urination.  Denies history of blood in urine and kidney or bladder stones Has vaginal estrogen cream but not using it.   Pelvic Organ Prolapse Symptoms:                  Patient Admits to a feeling of a bulge the vaginal area.  Noticed it about a year ago, feels it when she is in the shower.  It is not bothersome to her.   Bowel Symptom: Bowel movements: 2 time(s) per day Stool consistency:  loose, has celiac, has been better recently Straining: no.  Splinting: no.  Incomplete evacuation: no.  Patient Denies accidental bowel leakage / fecal incontinence Bowel regimen: diet  [facility] Colonoscopy          Upcoming     Colonoscopy (Every 10 Years) Next due on 02/24/2028    02/23/2018  [facility] Colonoscopy component of [facility] COLONOSCOPY   Only the first 1 history entries have been loaded, but more history exists.                Sexual Function Sexually active: not currently Sexual orientation: heterosexual Pain with sex: Yes, at the vaginal opening, has discomfort due to dryness  Pelvic Pain Denies pelvic pain    Past Medical History:  Past Medical History:  Diagnosis Date   ALS (amyotrophic lateral sclerosis) (HCC)    Celiac disease    Closed fracture of left distal radius    Left ulnar fracture    left   Osteoporosis      Past Surgical History:   Past Surgical History:  Procedure Laterality Date   ABDOMINAL HYSTERECTOMY  1994   has one ovary left   BUBBLE STUDY  07/06/2022   Procedure: BUBBLE STUDY;  Surgeon: Barbaraann Darryle Ned, MD;  Location: [facility] ENDOSCOPY;  Service: Cardiovascular;;   FEMUR FRACTURE SURGERY Right    OPEN REDUCTION INTERNAL FIXATION (ORIF) DISTAL RADIAL FRACTURE Left 10/31/2019   Procedure: OPEN REDUCTION INTERNAL FIXATION (ORIF) LEFT DISTAL RADIAL FRACTURE,  CLOSED REDUCTION LEFT ULNA;  Surgeon: Murrell Drivers, MD;  Location: [facility];  Service: Orthopedics;  Laterality: Left;   RIGHT/LEFT HEART CATH AND CORONARY ANGIOGRAPHY N/A 07/06/2022   Procedure: RIGHT/LEFT HEART CATH AND CORONARY ANGIOGRAPHY;  Surgeon: Verlin Lonni BIRCH, MD;  Location: [facility] INVASIVE CV LAB;  Service: Cardiovascular;  Laterality: N/A;   TEE WITHOUT CARDIOVERSION N/A 07/06/2022   Procedure: TRANSESOPHAGEAL ECHOCARDIOGRAM (TEE);  Surgeon: Barbaraann Darryle Ned, MD;  Location: [facility] ENDOSCOPY;  Service: Cardiovascular;  Laterality: N/A;     Past  OB/GYN History: OB History  Gravida Para Term Preterm AB Living  0 0 0 0 0 0  SAB IAB Ectopic Multiple Live Births  0 0 0 0 0   S/p hysterectomy   Medications: Patient has a current medication list which includes the following prescription(s): acetaminophen , alendronate , ascorbic acid, aspirin  ec, diphenhydramine-acetaminophen , [START ON 08/03/2024] estradiol , ferrous sulfate , folic acid , pantoprazole , rosuvastatin, tafluprost (pf), vitamin d , and amoxicillin .   Allergies: Patient is allergic to diclofenac, gluten meal, and wheat.   Social History:  Social History   Tobacco Use   Smoking status: Former    Types: Cigarettes   Smokeless tobacco: Never  Vaping Use   Vaping status: Never Used  Substance Use Topics   Alcohol use: Yes    Comment: occ.   Drug use: Never    Relationship status: married Patient lives with her husband.   Patient is employed as a Conservation officer, nature. Regular exercise: No History of abuse: No  Family History:   Family History  Problem Relation Age of Onset   Heart failure Mother    Diabetes Father    Colon cancer Neg Hx    Esophageal cancer Neg Hx    Renal cancer Neg Hx    Bladder Cancer Neg Hx    Uterine cancer Neg Hx      Review of Systems: Review of Systems  Constitutional:  Negative for fever, malaise/fatigue and weight loss.  Respiratory:  Negative for cough, shortness of breath and wheezing.   Cardiovascular:  Negative for chest pain, palpitations and leg swelling.  Gastrointestinal:  Negative for abdominal pain and blood in stool.  Genitourinary:  Negative for dysuria.  Musculoskeletal:  Negative for myalgias.  Skin:  Negative for rash.  Neurological:  Negative for dizziness and headaches.  Endo/Heme/Allergies:  Bruises/bleeds easily.  Psychiatric/Behavioral:  Negative for depression. The patient is not nervous/anxious.      OBJECTIVE Physical Exam: Vitals:   08/01/24 1050  BP: (!) 142/89  Pulse: 69  Weight: 145 lb (65.8 kg)  Height:  5' 6.14 (1.68 m)    Physical Exam Vitals reviewed. Exam conducted with a chaperone present.  Constitutional:      General: She is not in acute distress. Pulmonary:     Effort: Pulmonary effort is normal.  Abdominal:     General: There is no distension.     Palpations: Abdomen is soft.     Tenderness: There is no abdominal tenderness. There is no rebound.  Musculoskeletal:        General: No swelling. Normal range of motion.  Skin:    General: Skin is warm and dry.     Findings: No rash.  Neurological:     Mental Status: She is alert and oriented to person, place, and time.  Psychiatric:        Mood and Affect: Mood normal.        Behavior: Behavior normal.      GU / Detailed Urogynecologic Evaluation:  Pelvic Exam: Normal external female genitalia; Bartholin's and  Skene's glands normal in appearance; urethral meatus normal in appearance, no urethral masses or discharge.   CST: negative s/p hysterectomy: Speculum exam reveals normal vaginal mucosa with  atrophy and normal vaginal cuff.  Adnexa no mass, fullness, tenderness.     Pelvic floor strength I/V  Pelvic floor musculature: Right levator non-tender, Right obturator non-tender, Left levator non-tender, Left obturator non-tender  POP-Q:   POP-Q  -3                                            Aa   -3                                           Ba  -6                                              C   1.5                                            Gh  3.5                                            Pb  6                                            tvl   -3                                            Ap  -3                                            Bp                                                 D      Rectal Exam:  deferred  Post-Void Residual (PVR) by Bladder Scan: In order to evaluate bladder emptying, we discussed obtaining a postvoid residual and patient agreed to this procedure.  Procedure:  The ultrasound unit was placed on the patient's abdomen in the suprapubic region after the patient had voided.    Post Void Residual - 08/01/24 1102       Post Void Residual   Post Void Residual 9 mL           Laboratory Results: Lab Results  Component Value Date   COLORU yellow 08/01/2024   CLARITYU clear 08/01/2024  GLUCOSEUR negative 08/01/2024   BILIRUBINUR negative 08/01/2024   SPECGRAV 1.010 08/01/2024   RBCUR negative 08/01/2024   PHUR 5.5 08/01/2024   PROTEINUR NEGATIVE 01/24/2024   UROBILINOGEN 0.2 08/01/2024   LEUKOCYTESUR Trace (A) 08/01/2024    Lab Results  Component Value Date   CREATININE 0.87 03/28/2024   CREATININE 1.01 (H) 01/24/2024   CREATININE 0.95 06/08/2022    Lab Results  Component Value Date   HGBA1C 5.8 (H) 06/13/2019    Lab Results  Component Value Date   HGB 10.6 (L) 06/09/2024     ASSESSMENT AND PLAN Ms. Cumberland is a 68 y.o. with:  1. Recurrent UTI   2. Dyspareunia in female     Recurrent UTI Assessment & Plan: - For treatment of recurrent urinary tract infections, we discussed management of recurrent UTIs including prophylaxis with a daily low dose antibiotic, transvaginal estrogen therapy, D-mannose, and cranberry supplements.  - Estradiol  cream prescribed. Recommended that she can obtain the supplements over the counter.   - If she has symptoms of a UTI, she can call the office to provide a urine sample for treatment.   Orders: -     POCT URINALYSIS DIP (CLINITEK) -     Estradiol ; Place 0.5g nightly for two weeks then twice a week after  Dispense: 42.5 g; Refill: 11  Dyspareunia in female Assessment & Plan: - discussed use of estradiol  for atrophy - can also use coconut oil or vitamin E to help with dryness and lubrication - offered pelvic PT but declined   Return 6 months or sooner if needed   Rosaline LOISE Caper, MD

## 2024-08-01 NOTE — Assessment & Plan Note (Signed)
-   discussed use of estradiol  for atrophy - can also use coconut oil or vitamin E to help with dryness and lubrication - offered pelvic PT but declined

## 2024-08-04 ENCOUNTER — Ambulatory Visit: Admitting: Dietician

## 2024-08-07 DIAGNOSIS — S22080D Wedge compression fracture of T11-T12 vertebra, subsequent encounter for fracture with routine healing: Secondary | ICD-10-CM | POA: Diagnosis not present

## 2024-08-07 DIAGNOSIS — M545 Low back pain, unspecified: Secondary | ICD-10-CM | POA: Diagnosis not present

## 2024-08-15 DIAGNOSIS — M545 Low back pain, unspecified: Secondary | ICD-10-CM | POA: Diagnosis not present

## 2024-08-15 DIAGNOSIS — S22080D Wedge compression fracture of T11-T12 vertebra, subsequent encounter for fracture with routine healing: Secondary | ICD-10-CM | POA: Diagnosis not present

## 2024-08-17 ENCOUNTER — Ambulatory Visit (INDEPENDENT_AMBULATORY_CARE_PROVIDER_SITE_OTHER): Payer: Medicare HMO | Admitting: Otolaryngology

## 2024-08-24 DIAGNOSIS — M545 Low back pain, unspecified: Secondary | ICD-10-CM | POA: Diagnosis not present

## 2024-08-24 DIAGNOSIS — S22080D Wedge compression fracture of T11-T12 vertebra, subsequent encounter for fracture with routine healing: Secondary | ICD-10-CM | POA: Diagnosis not present

## 2024-10-23 DIAGNOSIS — Z0100 Encounter for examination of eyes and vision without abnormal findings: Secondary | ICD-10-CM | POA: Diagnosis not present

## 2024-10-23 DIAGNOSIS — H401123 Primary open-angle glaucoma, left eye, severe stage: Secondary | ICD-10-CM | POA: Diagnosis not present

## 2024-11-15 ENCOUNTER — Encounter: Payer: Self-pay | Admitting: Oncology

## 2024-11-28 ENCOUNTER — Encounter: Payer: Self-pay | Admitting: Medical Oncology
